# Patient Record
Sex: Female | Born: 1990 | Race: Black or African American | Hispanic: No | Marital: Single | Smoking: Current every day smoker
Health system: Southern US, Community
[De-identification: ages and names within clinical notes are randomized; demographics above are authoritative.]

## PROBLEM LIST (undated history)

## (undated) DIAGNOSIS — F419 Anxiety disorder, unspecified: Secondary | ICD-10-CM

## (undated) DIAGNOSIS — F32A Depression, unspecified: Secondary | ICD-10-CM

## (undated) DIAGNOSIS — F319 Bipolar disorder, unspecified: Secondary | ICD-10-CM

## (undated) HISTORY — DX: Depression, unspecified: F32.A

## (undated) HISTORY — DX: Anxiety disorder, unspecified: F41.9

## (undated) HISTORY — PX: KELOID EXCISION: SHX1856

---
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
## 2008-03-16 ENCOUNTER — Emergency Department: Payer: Self-pay | Admitting: Emergency Medicine

## 2009-02-19 ENCOUNTER — Emergency Department: Payer: Self-pay | Admitting: Emergency Medicine

## 2009-02-21 ENCOUNTER — Emergency Department: Payer: Self-pay | Admitting: Emergency Medicine

## 2009-06-24 ENCOUNTER — Emergency Department: Payer: Self-pay | Admitting: Emergency Medicine

## 2010-01-02 ENCOUNTER — Emergency Department: Payer: Self-pay | Admitting: Unknown Physician Specialty

## 2011-01-21 ENCOUNTER — Emergency Department: Payer: Self-pay | Admitting: Internal Medicine

## 2013-05-10 HISTORY — PX: INCISION AND DRAINAGE PERIRECTAL ABSCESS: SHX1804

## 2013-05-13 LAB — CBC
HGB: 14.5 g/dL (ref 12.0–16.0)
MCH: 30.6 pg (ref 26.0–34.0)
MCHC: 35 g/dL (ref 32.0–36.0)
MCV: 87 fL (ref 80–100)
Platelet: 177 10*3/uL (ref 150–440)
RBC: 4.75 10*6/uL (ref 3.80–5.20)
RDW: 12.9 % (ref 11.5–14.5)
WBC: 10.7 10*3/uL (ref 3.6–11.0)

## 2013-05-13 LAB — URINALYSIS, COMPLETE
Bilirubin,UR: NEGATIVE
Blood: NEGATIVE
Glucose,UR: NEGATIVE mg/dL (ref 0–75)
Ph: 7 (ref 4.5–8.0)
RBC,UR: <1 /HPF (ref 0–5)
Specific Gravity: 1.009 (ref 1.003–1.030)
Squamous Epithelial: 2
WBC UR: <1 /HPF (ref 0–5)

## 2013-05-13 LAB — SALICYLATE LEVEL: Salicylates, Serum: 4.1 mg/dL — ABNORMAL HIGH

## 2013-05-13 LAB — COMPREHENSIVE METABOLIC PANEL
Albumin: 4.1 g/dL (ref 3.4–5.0)
Calcium, Total: 9.2 mg/dL (ref 8.5–10.1)
Chloride: 103 mmol/L (ref 98–107)
Co2: 25 mmol/L (ref 21–32)
Osmolality: 272 (ref 275–301)
Potassium: 3.3 mmol/L — ABNORMAL LOW (ref 3.5–5.1)
SGOT(AST): 22 U/L (ref 15–37)
SGPT (ALT): 18 U/L (ref 12–78)
Total Protein: 9.5 g/dL — ABNORMAL HIGH (ref 6.4–8.2)

## 2013-05-13 LAB — TSH: Thyroid Stimulating Horm: 1.41 u[IU]/mL

## 2013-05-14 ENCOUNTER — Inpatient Hospital Stay: Payer: Self-pay | Admitting: Psychiatry

## 2013-05-15 LAB — DRUG SCREEN, URINE
Amphetamines, Ur Screen: NEGATIVE (ref ?–1000)
Barbiturates, Ur Screen: NEGATIVE (ref ?–200)
Benzodiazepine, Ur Scrn: POSITIVE (ref ?–200)
Cannabinoid 50 Ng, Ur ~~LOC~~: POSITIVE (ref ?–50)
Cocaine Metabolite,Ur ~~LOC~~: NEGATIVE (ref ?–300)
MDMA (Ecstasy)Ur Screen: NEGATIVE (ref ?–500)
Methadone, Ur Screen: NEGATIVE (ref ?–300)
Opiate, Ur Screen: NEGATIVE (ref ?–300)
Phencyclidine (PCP) Ur S: NEGATIVE (ref ?–25)

## 2013-11-29 ENCOUNTER — Inpatient Hospital Stay: Payer: Self-pay | Admitting: Psychiatry

## 2013-11-29 LAB — DRUG SCREEN, URINE
Amphetamines, Ur Screen: NEGATIVE (ref ?–1000)
BARBITURATES, UR SCREEN: NEGATIVE (ref ?–200)
BENZODIAZEPINE, UR SCRN: NEGATIVE (ref ?–200)
Cannabinoid 50 Ng, Ur ~~LOC~~: POSITIVE (ref ?–50)
Cocaine Metabolite,Ur ~~LOC~~: NEGATIVE (ref ?–300)
MDMA (Ecstasy)Ur Screen: NEGATIVE (ref ?–500)
Methadone, Ur Screen: NEGATIVE (ref ?–300)
Opiate, Ur Screen: NEGATIVE (ref ?–300)
PHENCYCLIDINE (PCP) UR S: NEGATIVE (ref ?–25)
Tricyclic, Ur Screen: NEGATIVE (ref ?–1000)

## 2013-11-29 LAB — COMPREHENSIVE METABOLIC PANEL
ANION GAP: 5 — AB (ref 7–16)
Albumin: 4.6 g/dL (ref 3.4–5.0)
Alkaline Phosphatase: 64 U/L
BILIRUBIN TOTAL: 0.6 mg/dL (ref 0.2–1.0)
BUN: 6 mg/dL — ABNORMAL LOW (ref 7–18)
CALCIUM: 9.3 mg/dL (ref 8.5–10.1)
Chloride: 105 mmol/L (ref 98–107)
Co2: 26 mmol/L (ref 21–32)
Creatinine: 0.82 mg/dL (ref 0.60–1.30)
EGFR (African American): >60
EGFR (Non-African Amer.): >60
GLUCOSE: 111 mg/dL — AB (ref 65–99)
Osmolality: 270 (ref 275–301)
Potassium: 3.3 mmol/L — ABNORMAL LOW (ref 3.5–5.1)
SGOT(AST): 28 U/L (ref 15–37)
SGPT (ALT): 19 U/L (ref 12–78)
SODIUM: 136 mmol/L (ref 136–145)
Total Protein: 10.5 g/dL — ABNORMAL HIGH (ref 6.4–8.2)

## 2013-11-29 LAB — URINALYSIS, COMPLETE
BILIRUBIN, UR: NEGATIVE
Bacteria: NONE SEEN
Blood: NEGATIVE
Glucose,UR: NEGATIVE mg/dL (ref 0–75)
Leukocyte Esterase: NEGATIVE
Nitrite: NEGATIVE
Ph: 6 (ref 4.5–8.0)
Protein: 100
Specific Gravity: 1.026 (ref 1.003–1.030)
Squamous Epithelial: 4
WBC UR: 1 /HPF (ref 0–5)

## 2013-11-29 LAB — CBC
HCT: 41.1 % (ref 35.0–47.0)
HGB: 14.1 g/dL (ref 12.0–16.0)
MCH: 30.5 pg (ref 26.0–34.0)
MCHC: 34.3 g/dL (ref 32.0–36.0)
MCV: 89 fL (ref 80–100)
PLATELETS: 167 10*3/uL (ref 150–440)
RBC: 4.62 10*6/uL (ref 3.80–5.20)
RDW: 13.1 % (ref 11.5–14.5)
WBC: 11.5 10*3/uL — ABNORMAL HIGH (ref 3.6–11.0)

## 2013-11-29 LAB — ETHANOL: Ethanol: <3 mg/dL

## 2013-11-29 LAB — ACETAMINOPHEN LEVEL: Acetaminophen: <2 ug/mL

## 2013-11-29 LAB — SALICYLATE LEVEL: SALICYLATES, SERUM: 4.3 mg/dL — AB

## 2014-07-24 IMAGING — CT CT HEAD WITHOUT CONTRAST
1 series · 16 of 30 positions shown, 20 images · non-contrast
Comparison: none

REASON FOR EXAM: psychosis
COMMENTS:

PROCEDURE:     CT  - CT HEAD WITHOUT CONTRAST  - May 14, 2013 [DATE]
RESULT:     History: Psychosis.
Comparison Study: No prior.

[Series 4: soft (id) · axial · 0.42mm/px · z∈[-144,-10]mm · 16 of 31 slices shown, 20 images]
[im 2/31  brain]
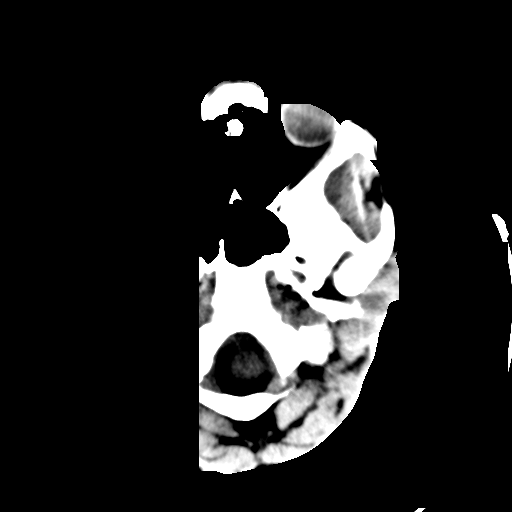
[im 2/31  bone]
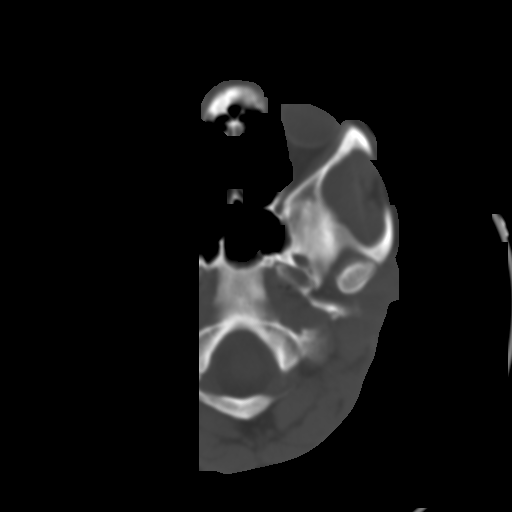
[im 4/31  brain]
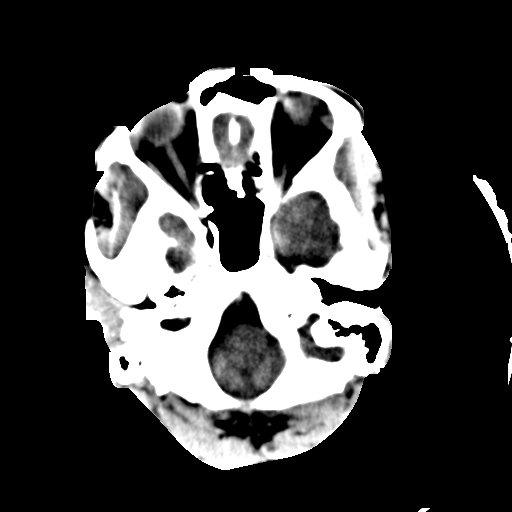
[im 6/31  brain]
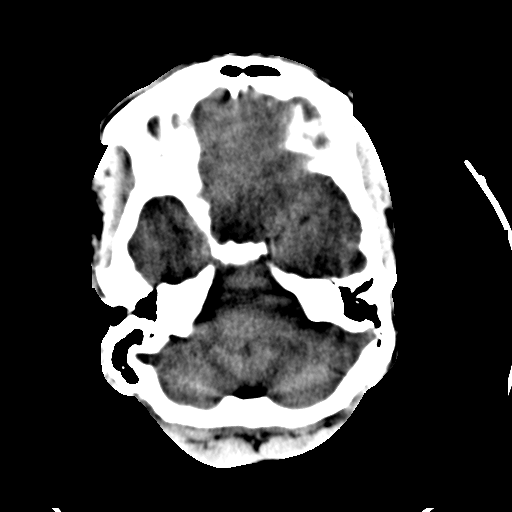
[im 8/31  brain]
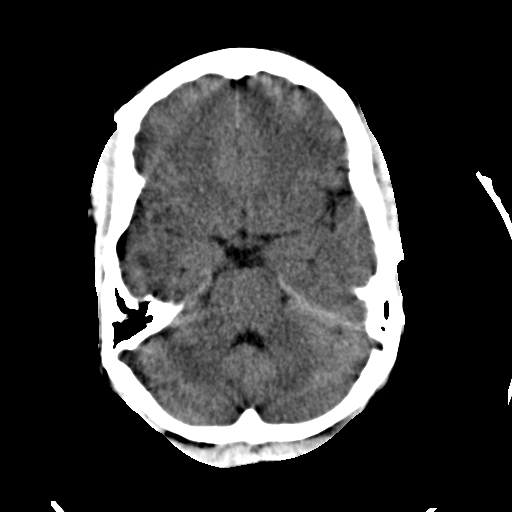
[im 9/31  brain]
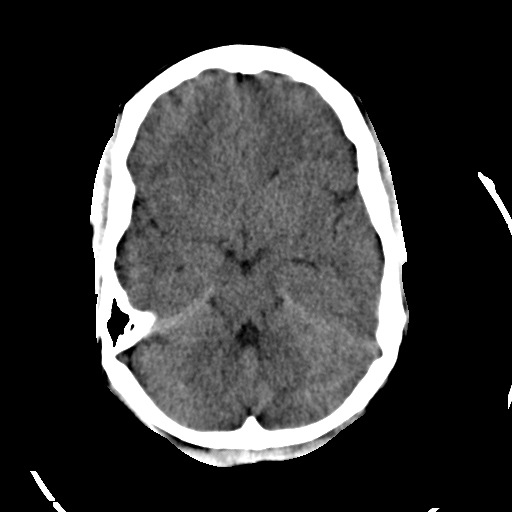
[im 9/31  bone]
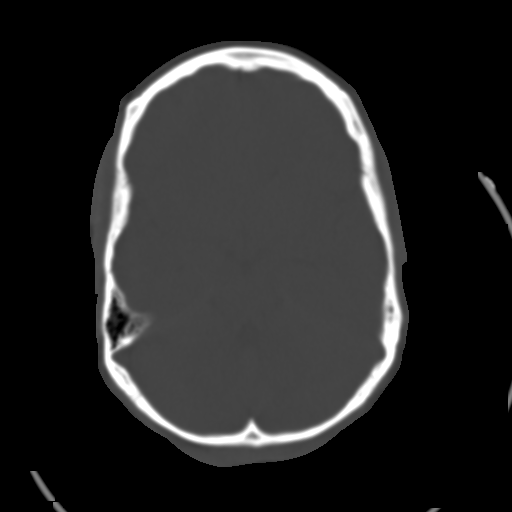
[im 11/31  brain]
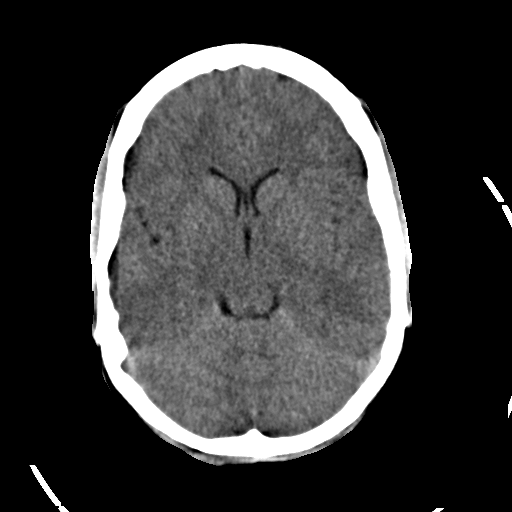
[im 13/31  brain]
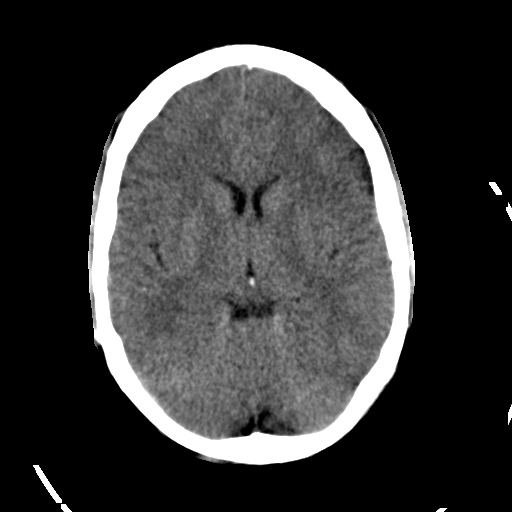
[im 15/31  brain]
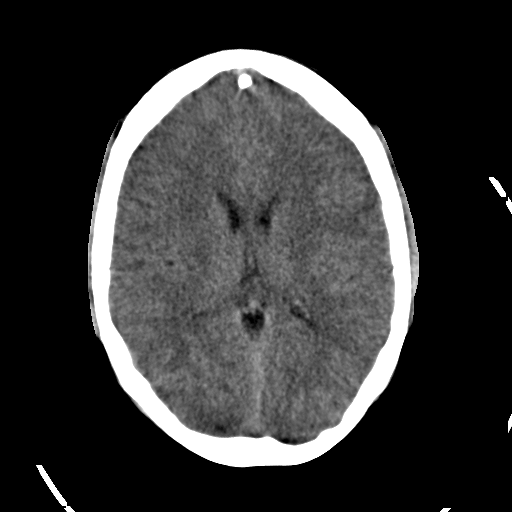
[im 16/31  brain]
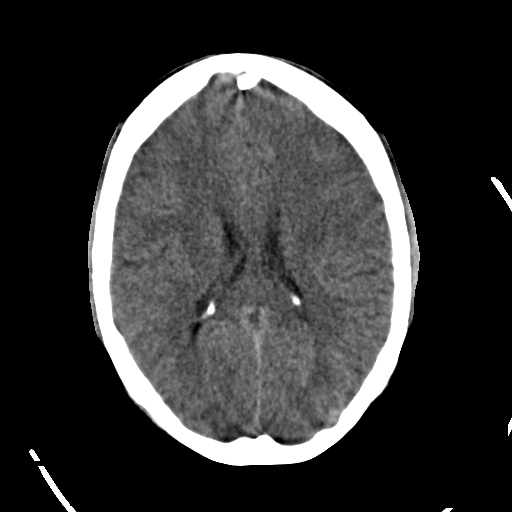
[im 16/31  bone]
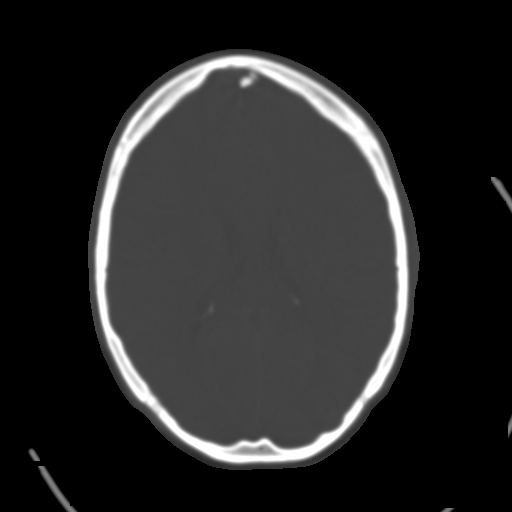
[im 18/31  brain]
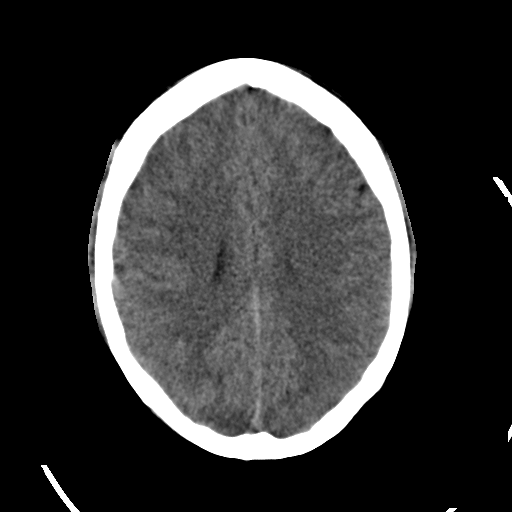
[im 20/31  brain]
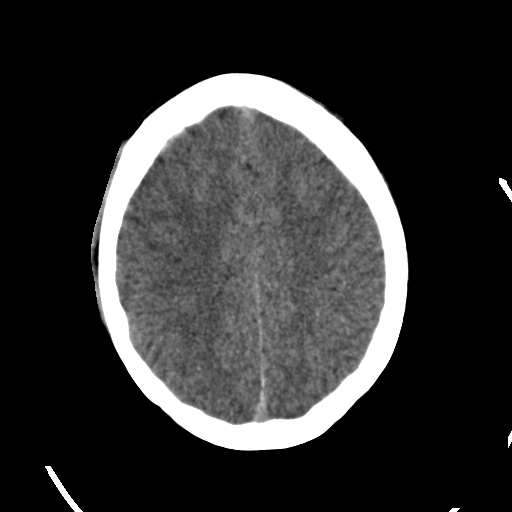
[im 22/31  brain]
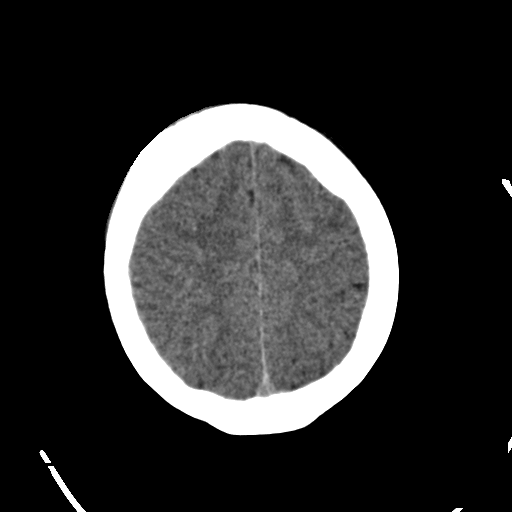
[im 23/31  brain]
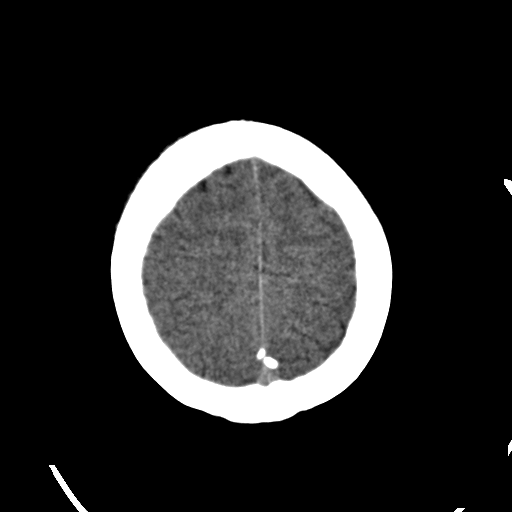
[im 23/31  bone]
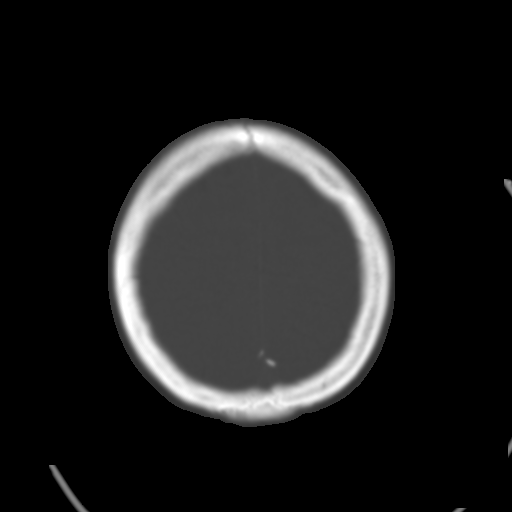
[im 25/31  brain]
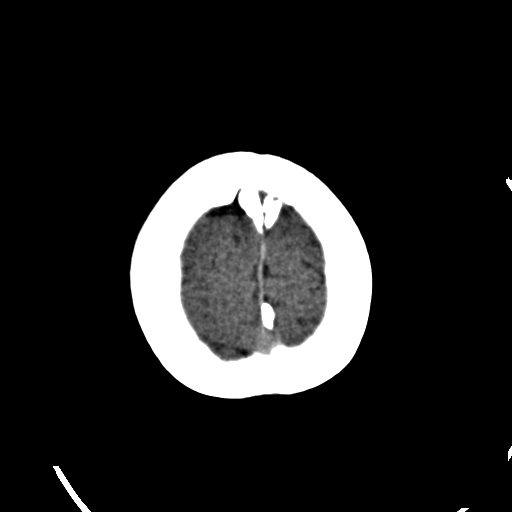
[im 27/31  brain]
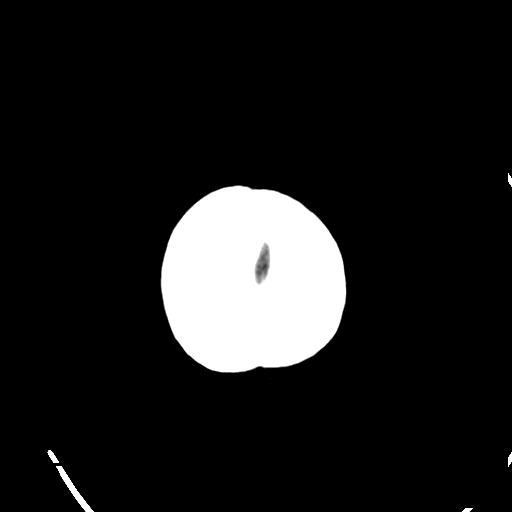
[im 29/31  brain]
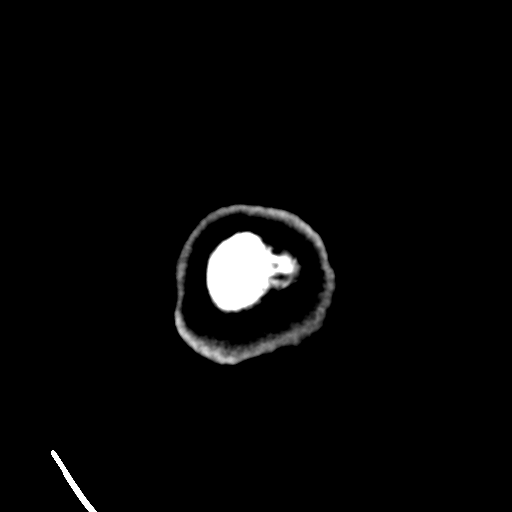

[16 of 30 positions shown; findings below may reference images not displayed]

FINDINGS: Standard nonenhanced CT obtained. No mass or hydrocephalus. No
hemorrhage. No acute bony abnormality identified.
IMPRESSION: No acute abnormality.

## 2014-10-15 ENCOUNTER — Emergency Department: Payer: Self-pay | Admitting: Emergency Medicine

## 2014-10-15 LAB — COMPREHENSIVE METABOLIC PANEL
ALBUMIN: 3.8 g/dL (ref 3.4–5.0)
ALK PHOS: 69 U/L
ALT: 26 U/L
AST: 34 U/L (ref 15–37)
Anion Gap: 4 — ABNORMAL LOW (ref 7–16)
BUN: 7 mg/dL (ref 7–18)
Bilirubin,Total: 0.4 mg/dL (ref 0.2–1.0)
CHLORIDE: 106 mmol/L (ref 98–107)
Calcium, Total: 8.9 mg/dL (ref 8.5–10.1)
Co2: 27 mmol/L (ref 21–32)
Creatinine: 0.89 mg/dL (ref 0.60–1.30)
EGFR (Non-African Amer.): >60
Glucose: 97 mg/dL (ref 65–99)
Osmolality: 272 (ref 275–301)
Potassium: 3.8 mmol/L (ref 3.5–5.1)
Sodium: 137 mmol/L (ref 136–145)
TOTAL PROTEIN: 8.9 g/dL — AB (ref 6.4–8.2)

## 2014-10-15 LAB — URINALYSIS, COMPLETE
BILIRUBIN, UR: NEGATIVE
BLOOD: NEGATIVE
Glucose,UR: NEGATIVE mg/dL (ref 0–75)
Ketone: NEGATIVE
LEUKOCYTE ESTERASE: NEGATIVE
NITRITE: NEGATIVE
PROTEIN: NEGATIVE
Ph: 6 (ref 4.5–8.0)
RBC, UR: NONE SEEN /HPF (ref 0–5)
Specific Gravity: 1.021 (ref 1.003–1.030)
Squamous Epithelial: 3
WBC UR: NONE SEEN /HPF (ref 0–5)

## 2014-10-15 LAB — CBC WITH DIFFERENTIAL/PLATELET
Basophil #: 0.1 10*3/uL (ref 0.0–0.1)
Basophil %: 0.6 %
EOS PCT: 4.2 %
Eosinophil #: 0.3 10*3/uL (ref 0.0–0.7)
HCT: 43 % (ref 35.0–47.0)
HGB: 14.5 g/dL (ref 12.0–16.0)
LYMPHS ABS: 1.5 10*3/uL (ref 1.0–3.6)
LYMPHS PCT: 18.7 %
MCH: 30.2 pg (ref 26.0–34.0)
MCHC: 33.6 g/dL (ref 32.0–36.0)
MCV: 90 fL (ref 80–100)
Monocyte #: 0.6 x10 3/mm (ref 0.2–0.9)
Monocyte %: 7 %
Neutrophil #: 5.7 10*3/uL (ref 1.4–6.5)
Neutrophil %: 69.5 %
Platelet: 203 10*3/uL (ref 150–440)
RBC: 4.79 10*6/uL (ref 3.80–5.20)
RDW: 13.6 % (ref 11.5–14.5)
WBC: 8.2 10*3/uL (ref 3.6–11.0)

## 2014-10-15 LAB — TSH: Thyroid Stimulating Horm: 1.75 u[IU]/mL

## 2015-01-17 NOTE — H&P (Signed)
PATIENT NAME:  Desiree Young, Desiree Young MR#:  [id number] DATE OF BIRTH:  [birth year]  DATE OF ADMISSION:  05/14/2013  REFERRING PHYSICIAN: Emergency Room M.D.   ATTENDING PHYSICIAN: Kristine LineaJolanta Pucilowska, M.D.   IDENTIFYING DATA: Desiree Young is a 24 year old female with no past psychiatric history.   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 She fears that there will be consequences. Yesterday with her boyfriend, she went to [employer], and she had the feeling that people were following her, people from work. She felt the same way in the parking lot. She became rather upset and fearful. Her family brought her to the Emergency Room. The patient denies any symptoms of depression, anxiety. She denies alcohol or illicit substance use except that she smokes marijuana regularly, 3 times a day for years. Her father recalls that 6 months or so ago, the patient has a similar brief episode of paranoia that resolved completely without treatment. She denies auditory hallucinations.   PAST PSYCHIATRIC HISTORY: None. No hospitalizations, treatment or suicide attempts.   FAMILY PSYCHIATRIC HISTORY: There was a great uncle or grandfather, she is not certain, with some kind of mental illness.   PAST MEDICAL HISTORY: None.   ALLERGIES: No known drug allergies.   MEDICATIONS ON ADMISSION: None.   SOCIAL HISTORY: She lives with her mom, dad and grandmother. She works at [employer]. She has a steady boyfriend. There are no legal issues. She uses marijuana.    REVIEW OF SYSTEMS:   CONSTITUTIONAL: No fevers or chills. No weight changes.  EYES: No double or blurred vision.  ENT: No hearing loss.  RESPIRATORY: No shortness of breath or cough.  CARDIOVASCULAR: No chest pain or orthopnea.  GASTROINTESTINAL: No abdominal pain, nausea, vomiting or diarrhea.  GENITOURINARY: No incontinence or frequency.  ENDOCRINE: No heat or cold intolerance.  LYMPHATIC: No anemia or easy bruising.  INTEGUMENTARY: No acne or rash.  MUSCULOSKELETAL: No muscle or joint pain.  NEUROLOGIC: No tingling or weakness.  PSYCHIATRIC: See history of present illness for details.   PHYSICAL EXAMINATION:  VITAL SIGNS: Blood pressure 142/98, pulse 74, respirations 18, temperature 98.2.  GENERAL: This is a slender young female in no acute distress.  HEENT: The pupils are equal, round and reactive to light. Sclerae anicteric.  NECK: Supple. No thyromegaly.  LUNGS: Clear to auscultation. No dullness to percussion.  HEART: Regular rhythm and rate. No murmurs, rubs or gallops.  ABDOMEN: Soft, nontender, nondistended. Positive bowel sounds.  MUSCULOSKELETAL: Normal muscle strength in all extremities.  SKIN: No rashes or bruises.  LYMPHATIC: No cervical adenopathy.  NEUROLOGIC: Cranial nerves II through XII are intact.   LABORATORY DATA: Chemistries are within normal limits except for blood glucose of 154, sodium 135 and potassium 3.3. Blood alcohol level is zero. LFTs within normal limits except for total protein of 9.5. TSH 1.41. Urine tox screen positive for benzodiazepines and cannabinoids. CBC within normal limits. Urinalysis is not suggestive of urinary tract infection. Serum acetaminophen and salicylates are  low. Urine pregnancy test is negative.   MENTAL STATUS EXAMINATION: The patient is alert and oriented to person, place, time and somewhat to situation. She is pleasant, polite and cooperative. She is in bed, wearing hospital scrubs. She maintains good eye contact. Her  speech is soft. Mood is depressed with anxious affect. Thought processing is logical and goal oriented. Thought content: She denies suicidal or homicidal ideation. She is delusional and paranoid. She denies auditory hallucinations. Her cognition is grossly intact. Her insight and judgment are fair.   SUICIDE RISK ASSESSMENT ON ADMISSION: This is a patient with no past psychiatric history, with first psychotic break, most likely related to substance use.   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  AXIS II: Deferred.  AXIS III: Deferred.  AXIS IV: New onset mental illness.  AXIS V: Global assessment of functioning on admission 25.   PLAN: The patient was admitted to [facility] Medicine unit for safety, stabilization and medication management. She was initially placed on suicide precautions and was closely monitored for any unsafe behaviors. She underwent full psychiatric and risk assessment. She received pharmacotherapy, individual and group psychotherapy, substance abuse counseling and support from therapeutic milieu.  1. Psychosis: We will start Risperdal and a sleeping pill.  2. Collateral data.  3. Disposition: She will be discharged to home.   ____________________________ Ellin Goodie. Jennet Maduro, MD jbp:gb D: 05/14/2013 21:03:14 ET T: 05/14/2013 21:49:51 ET JOB#: [id number]  cc: Jolanta B. Jennet Maduro, MD, <Dictator> Shari Prows MD ELECTRONICALLY SIGNED 05/15/2013 23:30

## 2015-01-18 NOTE — H&P (Signed)
PATIENT NAME:  Desiree Young, Desiree Young MR#:  [id number] DATE OF BIRTH:  September 23, 1991  DATE OF ADMISSION:  11/29/2013  DATE OF ASSESSMENT: 11/30/2013   REFERRING PHYSICIAN: Emergency Room MD   ATTENDING PHYSICIAN: Shakiyah Cirilo B. Jennet Maduro, MD   IDENTIFYING DATA: [name] is a 24 year old female with history of psychosis.   CHIEF COMPLAINT: "I need my medications."   HISTORY OF PRESENT ILLNESS: [name] was hospitalized at [facility] in August 2014 for an episode of paranoia. At that time, we thought that it was precipitated by cannabis abuse. The patient was discharged after a very brief hospitalization and prescribed Risperdal 2 mg in the evening. She reports that she did take it for 2 months and then she stopped. She was okay all along up until 3 days ago when she again became paranoid. She was very suspicious that people at work are watching her and may be trying to hurt her. As a result, she lost her job. She was suspicious that her boyfriend has a relationship with her best friend and confronted her best friend about it. She thinks now that it was most likely not real. She also started behaving strangely at home. She lives with her parents, her sister, and her niece. She had an argument with her family and in [place], she stated that if they want her out of the house, then she may as well kill herself. She says that she never meant to hurt herself. She is a good Christian, knows that she will not go to heaven should she hurt herself. She denies any symptoms of depression. Even though she tells me that she is depressed, there is no crying, no feeling of guilt, hopelessness, worthlessness, no changes in level of energy. There is good concentration and memory. She does not complain of social isolation, anhedonia. She sleeps well. She enjoys life, except for the past 3 days. Dr. Garnetta Buddy, admitting psychiatrist, put her on Zoloft, but she was sweaty all over last night, had to change her  bedding and clothes, and would prefer not to take the Zoloft. She is open to taking Risperdal. She is a marijuana smoker and has been smoking daily for extended period of time. She does not believe that marijuana has anything to do with her symptoms, as this is just the second brief episode of mental illness, while smoking has been going on all along. She does agree that there are different batches of marijuana and that it is not impossible that it is relating to her substance use. At the moment, she adamantly denies thoughts of hurting herself or others. There are no symptoms suggestive of bipolar mania. She does not appear anxious.   PAST PSYCHIATRIC HISTORY: One hospitalization in August 2014 for substance-induced psychosis. This resolved with Risperdal treatment. She does not have a psychiatrist at the moment. She denies ever attempting a suicide or attempting to hurt herself.   FAMILY PSYCHIATRIC HISTORY: Unspecified mental illness in the family.   PAST MEDICAL HISTORY: None.   ALLERGIES: No known drug allergies.   MEDICATIONS ON ADMISSION: None.   SOCIAL HISTORY: The patient had a part-time job through a [employer], but lost it the same way she lost her other job in August. 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 Per Dr. Micki Riley note, the patient lives by herself. She has a steady boyfriend. In spite of accusations, she thinks that  the relationship will survive. She denies legal charges. She denies, other than marijuana, substance use. She does not drink alcohol.   REVIEW OF SYSTEMS: CONSTITUTIONAL: No fevers or chills. Positive for gradual weight gain that is welcomed.  EYES: No double or blurred vision.  ENT: No hearing loss.  RESPIRATORY: No shortness of breath or cough.  CARDIOVASCULAR: No chest pain or orthopnea.  GASTROINTESTINAL: No abdominal pain, nausea, vomiting, or diarrhea.   GENITOURINARY: No incontinence or frequency.  ENDOCRINE: No heat or cold intolerance.  LYMPHATIC: No anemia or easy bruising.  INTEGUMENTARY: No acne or rash.  MUSCULOSKELETAL: No muscle or joint pain.  NEUROLOGIC: No tingling or weakness.  PSYCHIATRIC: See history of present illness for details.   PHYSICAL EXAMINATION: VITAL SIGNS: Blood pressure 130/89, pulse 79, respirations 18, temperature 98.5.  GENERAL: This is a slender young female in no acute distress.  HEENT: The pupils are equal, round, and reactive to light. Sclerae are anicteric.  NECK: Supple. No thyromegaly.  LUNGS: Clear to auscultation. No dullness to percussion.  HEART: Regular rhythm and rate. No murmurs, rubs, or gallops.  ABDOMEN: Soft, nontender, nondistended. Positive bowel sounds.  MUSCULOSKELETAL: Normal muscle strength in all extremities.  SKIN: No rashes or bruises.  LYMPHATIC: No cervical adenopathy.  NEUROLOGIC: Cranial nerves II through XII are intact.   LABORATORY DATA: Chemistries are within normal limits, except for potassium 3.3. Blood alcohol level zero. LFTs within normal limits. Urine tox screen positive for cannabis. CBC within normal limits, except for white blood count of 11.5. Urinalysis is not suggestive of urinary tract infection. Serum acetaminophen less than 2. Serum salicylates 4.3. Urine pregnancy test is negative.   MENTAL STATUS EXAMINATION ON ADMISSION: The patient is alert and oriented to person, place, time, and situation. She is pleasant, polite, and cooperative. She is well groomed and casually dressed. She maintains good eye contact. Her speech is soft. Mood is fine with full affect. Thought process is logical and goal oriented. Thought content: She denies thoughts of hurting herself or others, but did voice suicidal thoughts on admission. She is paranoid and delusional. She denies experiencing auditory or visual hallucinations. Her cognition is grossly intact. She registers 3 out of 3  and recalls 3 out of 3 objects after 5 minutes. She can spell "world" forward and backward. She knows the current president. Her intelligence is average with average fund of knowledge. Her insight and judgment seem poor.  INITIAL DIAGNOSES:  AXIS I: :Substance-induced psychosis, marijuana dependence.  AXIS II: Deferred.  AXIS III: Deferred.  AXIS IV: Substance abuse, mental illness, relationship, family conflict, occupational, access to care.  AXIS V: Global Assessment of Functioning 25.   PLAN: The patient was admitted to [facility] Medicine Unit for safety, stabilization, and medication management. She was initially placed on suicide precautions and was closely monitored for any unsafe behaviors. She underwent full psychiatric and risk assessment. She received pharmacotherapy, individual and group psychotherapy, substance abuse counseling, and support from therapeutic milieu.  1.  Suicidal ideation: The patient adamantly denies.  2.  Mood and psychosis: Dr. Garnetta Buddy placed her on Zoloft and low-dose Risperdal. We will discontinue Zoloft due to heavy sweating at night and increase Risperdal to 2, 3 mg at night. The patient has no health insurance. She could benefit from injectable antipsychotic that may be easier obtainable in the community.  3.  Substance abuse: She has little insight into this problem and is unwilling to stop. She declines substance abuse treatment participation.  4.  Disposition: Social worker will contact the family to see if we can obtain collateral data. She may not need a long hospitalization if compliant with medicines.   ____________________________ Ellin GoodieJolanta B. Jennet MaduroPucilowska, MD jbp:jcm D: 11/30/2013 14:25:14 ET T: 11/30/2013 15:06:25 ET JOB#: [id number]  cc: Jermaine Tholl B. Jennet MaduroPucilowska, MD, <Dictator> Shari ProwsJOLANTA B Artis Beggs MD ELECTRONICALLY SIGNED 12/08/2013 15:10

## 2015-01-18 NOTE — Consult Note (Signed)
PATIENT NAME:  Desiree Young, Desiree Young MR#:  [id number] DATE OF BIRTH:  Feb 06, 1991  DATE OF ADMISSION: 11/29/2013   DATE OF CONSULTATION:  11/29/2013  REFERRING PHYSICIAN:  Aneta Mins A. Scotty Court, MD CONSULTING PHYSICIAN:  Ardeen Fillers. Garnetta Buddy, MD  REASON FOR CONSULTATION: Becoming agitated and paranoid.   HISTORY OF PRESENT ILLNESS: The patient is a 24 year old African-American female with a history of paranoia and noncompliance on the medication, who presented to the ED as she stated she was becoming more depressed and needed help. History was obtained from the patient. She reported that she thinks too much and talks too much. She reported that she was brought to the hospital by her boyfriend. She reported that initially she was not having any problems with her boyfriend, and she only told him that I love you. She also mentioned that she was having depression. She stated that she was last discharged from the hospital approximately 6 months ago, and at that time, she stopped going to the doctor and stopped taking her medications. She reported that she came to the hospital at that time as she felt that people were trying to set her up. She continues to have the same feelings, and she thinks that people are trying to set her up and her mind is racing all the time. She has feelings which build up inside her, and then she loses control. She becomes agitated, yells and screams most of the time. She stated that she loses control and is fearful at the time. The patient has feelings of depression and anxiety. She stated that she also uses marijuana at least 3 times per week. She also admits to having visual hallucinations of shadows. However, she became irate during the interview and stated that I should tell her what I wanted her to say. She was also focused on the medications and reported that she does not want to stay in the hospital for more than 2 days as she has her birthday on [birth year]. The patient was noted to be smiling  inappropriately during the interview. She reported that she does not want to stay for longer in the hospital at this time. She did not admit to having auditory hallucinations, but appears to be responding to internal stimuli.   PAST PSYCHIATRIC HISTORY: The patient was admitted to the hospital in August due to having paranoia. The people from the job were after her and were giving her bad instructions, and she was making irregular socks. She was given Risperdal at that time, and she responded well to the medication. However, she has a history of noncompliance with the medication, and she did not keep her outpatient appointments as well. There is no history of suicide attempts.   FAMILY HISTORY: The patient has a history of some mental illness in her family members.   PAST MEDICAL HISTORY: None.   ALLERGIES: No known drug allergies.   CURRENT MEDICATIONS: None, as she has been noncompliant with her medications.   SOCIAL HISTORY: The patient currently lives by herself. She reported that she has a part-time job, but she did not elaborate on the same. She used to work in a [employer] in the past. She stated that she has a steady boyfriend. Denied having any pending legal charges. She also smokes marijuana on a regular basis.   REVIEW OF SYSTEMS:  CONSTITUTIONAL: Denies any fever or chills. No weight changes.  EYES: No double or blurred vision.  ENT: No hearing loss.  RESPIRATORY: No shortness of breath or  cough.  CARDIOVASCULAR: No chest pain or orthopnea.  GASTROINTESTINAL: No abdominal pain, nausea, vomiting or diarrhea. GENITOURINARY: No incontinence or frequency.  ENDOCRINE: No heat or cold intolerance.  LYMPHATIC: No anemia or easy bruising.  INTEGUMENTARY: No acne or rash.  MUSCULOSKELETAL: Denies muscle or joint pain.   PHYSICAL EXAMINATION:  VITAL SIGNS: Temperature 98, pulse 98, respirations 18, blood pressure 133/79.   LABORATORY DATA: Blood glucose 111, BUN 6, creatinine 0.82,  sodium 136, potassium 3.3, chloride 105, bicarbonate 26, anion gap 5, osmolality 270, calcium 9.3. Blood alcohol level less than 3. Protein 10.5, albumin 4.6, bilirubin 0.6, alkaline phosphatase 54, AST 28, ALT 19. UDS positive for cannabinoids. WBC 11.5, hemoglobin 14.1, hematocrit 41.1, platelet count 167, MCV 89, RDW 13.1.   MENTAL STATUS EXAMINATION: The patient is a thinly built female who appeared her stated age. She was awake, alert and oriented x3. She became irate as well as noted to be screaming and yelling in the Behavioral Health Unit. She was unable to control her behavior and appeared to be agitated and was screaming and yelling. She was also becoming more paranoid and was banging her head against the wall. Her mood was anxious. Her thought process tangential. Thought content was delusional. She was responding to internal stimuli. Her cognition was intact. Her insight and judgment were poor. Language was intact. Fund of knowledge was appropriate.   DIAGNOSTIC IMPRESSION:  AXIS I:  1. Psychotic disorder, not otherwise specified. Rule out schizophrenia.  2. Cannabis dependence.  AXIS II: None.  AXIS III: None noted.   TREATMENT PLAN:  1. The patient will be admitted to [facility] for stabilization and safety.  2. She will be given a STAT dose of olanzapine 5 mg at this time.  3. She will be started back on Risperdal 0.5 mg p.o. b.i.d.  4. She will also be started on Zoloft 50 mg p.o. daily.  5. She will also be given trazodone 50 mg at bedtime.  6. The patient will be managed closely by the treatment team, and once she becomes stable, she will be discharged back home. Will obtain collateral history from family members.   Thank you for allowing me to participate in the care of this patient.   ____________________________ Ardeen FillersUzma S. Garnetta BuddyFaheem, MD usf:lb D: 11/29/2013 10:54:41 ET T: 11/29/2013 11:12:24 ET JOB#: [id number]  cc: Ardeen FillersUzma S. Garnetta BuddyFaheem, MD,  <Dictator> Rhunette CroftUZMA S Melitta Tigue MD ELECTRONICALLY SIGNED 11/29/2013 13:35

## 2016-05-05 ENCOUNTER — Emergency Department
Admission: EM | Admit: 2016-05-05 | Discharge: 2016-05-05 | Disposition: A | Discharge status: Home / Self Care | Payer: Self-pay | Attending: Emergency Medicine | Admitting: Emergency Medicine

## 2016-05-05 DIAGNOSIS — F17200 Nicotine dependence, unspecified, uncomplicated: Secondary | ICD-10-CM | POA: Insufficient documentation

## 2016-05-05 DIAGNOSIS — K611 Rectal abscess: Secondary | ICD-10-CM | POA: Insufficient documentation

## 2016-05-05 HISTORY — DX: Bipolar disorder, unspecified: F31.9

## 2016-05-05 LAB — POCT PREGNANCY, URINE: PREG TEST UR: NEGATIVE

## 2016-05-05 MED ORDER — HYDROCODONE-ACETAMINOPHEN 5-325 MG PO TABS: 1.0000 | ORAL_TABLET | ORAL | 0 refills | Status: DC | PRN

## 2016-05-05 MED ORDER — SULFAMETHOXAZOLE-TRIMETHOPRIM 800-160 MG PO TABS: 1.0000 | ORAL_TABLET | Freq: Two times a day (BID) | ORAL | 0 refills | Status: DC

## 2016-05-05 NOTE — ED Notes (Signed)
Pt to ed with c/o abscess to buttocks x 3 days, hx of same.  Pt reports increased swelling and pain with sitting.

## 2016-05-05 NOTE — ED Triage Notes (Signed)
Pt c/o abscess to right buttock X 3 days, no fever or odor. Prior hx of abcess. Pt alert and oriented X4, active, cooperative, pt in NAD. RR even and unlabored, color WNL.

## 2016-05-05 NOTE — ED Provider Notes (Signed)
[facility] Emergency Department Provider Note  ____________________________________________   First MD Initiated Contact with Patient 05/05/16 1409     (approximate)  I have reviewed the triage vital signs and the nursing notes.   HISTORY  Chief Complaint Abscess   HPI Desiree Young is a 25 y.o. female is here with complaint of abscess the last 3 days. Patient is unaware of any fever or chills. She states she has had a history of abscesses in the past. Patient has not been seen by a surgeon in the past for her complaint. She states that this is the same area that she's had problems with in the past. She states there is been increased swelling and pain with sitting. She has not taken any over-the-counter medication but has been doing a sitz bath once a day and using warm compresses 2-3 times per day. Pain is increased with sitting. Patient states when she is lying on her right side this gives her some relief. She rates her pain as a 10 over 10.     Past Medical History:  Diagnosis Date  . Bipolar 1 disorder (HCC)     There are no active problems to display for this patient.   History reviewed. No pertinent surgical history.  Prior to Admission medications   Medication Sig Start Date End Date Taking? Authorizing Provider  HYDROcodone-acetaminophen (NORCO/VICODIN) 5-325 MG tablet Take 1 tablet by mouth every 4 (four) hours as needed for moderate pain. 05/05/16   Tommi Rumpshonda L Wendelyn Kiesling, PA-C  sulfamethoxazole-trimethoprim (BACTRIM DS,SEPTRA DS) 800-160 MG tablet Take 1 tablet by mouth 2 (two) times daily. 05/05/16   Tommi Rumpshonda L Stephanieann Popescu, PA-C    Allergies Review of patient's allergies indicates no known allergies.  No family history on file.  Social History Social History  Substance Use Topics  . Smoking status: Current Every Day Smoker  . Smokeless tobacco: Never Used  . Alcohol use Yes    Review of Systems Constitutional: No  fever/chills Cardiovascular: Denies chest pain. Respiratory: Denies shortness of breath. Gastrointestinal:   No nausea, no vomiting.   Genitourinary: Negative for dysuria. Musculoskeletal: Negative for back pain. Skin: Negative for rash. Positive for abscess. Neurological: Negative for headaches, focal weakness or numbness.  10-point ROS otherwise negative.  ____________________________________________   PHYSICAL EXAM:  VITAL SIGNS: ED Triage Vitals  Enc Vitals Group     BP 05/05/16 1359 123/82     Pulse Rate 05/05/16 1359 98     Resp 05/05/16 1359 16     Temp 05/05/16 1359 98.8 F (37.1 C)     Temp Source 05/05/16 1359 Oral     SpO2 05/05/16 1359 99 %     Weight 05/05/16 1359 167 lb (75.8 kg)     Height 05/05/16 1359 5\' 5"  (1.651 m)     Head Circumference --      Peak Flow --      Pain Score 05/05/16 1400 10     Pain Loc --      Pain Edu? --      Excl. in GC? --     Constitutional: Alert and oriented. Well appearing and in no acute distress. Eyes: Conjunctivae are normal. PERRL. EOMI. Head: Atraumatic. Nose: No congestion/rhinnorhea. Neck: No stridor.   Cardiovascular: Normal rate, regular rhythm. Grossly normal heart sounds.  Good peripheral circulation. Respiratory: Normal respiratory effort.  No retractions. Lungs CTAB. Gastrointestinal: Soft and nontender. No distention.  Musculoskeletal: Moves upper and lower extremities without any difficulty. Normal  gait was noted. Neurologic:  Normal speech and language. No gross focal neurologic deficits are appreciated. No gait instability. Skin:  Skin is warm, dry and intact. There is an area approximately 1 cm vertically present with drainage that is purulent. There is no extending erythema present. Psychiatric: Mood and affect are normal. Speech and behavior are normal.  ____________________________________________   LABS (all labs ordered are listed, but only abnormal results are displayed)  Labs Reviewed  POC  URINE PREG, ED  POCT PREGNANCY, URINE     PROCEDURES  Procedure(s) performed: None  Procedures  Critical Care performed: No  ____________________________________________   INITIAL IMPRESSION / ASSESSMENT AND PLAN / ED COURSE  Pertinent labs & imaging results that were available during my care of the patient were reviewed by me and considered in my medical decision making (see chart for details).    Clinical Course  Patient was placed on Septra DS twice a day for 10 days after a negative pregnancy test. Norco as needed for pain. She is to increase sitz bath's and warm compresses. She was given the name of surgeon on call today who is Dr. Excell Seltzer. She is encouraged to make an appointment to follow-up for this.   ____________________________________________   FINAL CLINICAL IMPRESSION(S) / ED DIAGNOSES  Final diagnoses:  Perirectal abscess      NEW MEDICATIONS STARTED DURING THIS VISIT:  Discharge Medication List as of 05/05/2016  3:14 PM    START taking these medications   Details  HYDROcodone-acetaminophen (NORCO/VICODIN) 5-325 MG tablet Take 1 tablet by mouth every 4 (four) hours as needed for moderate pain., Starting Wed 05/05/2016, Print    sulfamethoxazole-trimethoprim (BACTRIM DS,SEPTRA DS) 800-160 MG tablet Take 1 tablet by mouth 2 (two) times daily., Starting Wed 05/05/2016, Print         Note:  This document was prepared using Dragon voice recognition software and may include unintentional dictation errors.    Tommi Rumps, PA-C 05/05/16 1522    Emily Filbert, MD 05/05/16 [phone]

## 2016-05-05 NOTE — Discharge Instructions (Signed)
Warm compresses frequently. Begin taking antibiotics as directed for the next 10 days. Norco as needed for pain. Follow-up with Dr. Excell Seltzerooper who is the surgeon on call today. Make an appointment to be seen in the office.

## 2016-05-12 ENCOUNTER — Encounter: Payer: Self-pay | Admitting: Surgery

## 2016-05-12 ENCOUNTER — Ambulatory Visit (INDEPENDENT_AMBULATORY_CARE_PROVIDER_SITE_OTHER): Payer: Self-pay | Admitting: Surgery

## 2016-05-12 DIAGNOSIS — L0501 Pilonidal cyst with abscess: Secondary | ICD-10-CM

## 2016-05-12 MED ORDER — HYDROCODONE-ACETAMINOPHEN 5-325 MG PO TABS: 1.0000 | ORAL_TABLET | ORAL | 0 refills | Status: DC | PRN

## 2016-05-12 MED ORDER — SULFAMETHOXAZOLE-TRIMETHOPRIM 800-160 MG PO TABS: 1.0000 | ORAL_TABLET | Freq: Two times a day (BID) | ORAL | 0 refills | Status: DC

## 2016-05-12 NOTE — Progress Notes (Signed)
Surgical Consultation  05/12/2016  Desiree Young is an 25 y.o. female.   CC: Perirectal abscess  HPI: This patient with a perirectal abscess who was started on Bactrim almost 2 weeks ago in the emergency room. The patient had this happen once before several years ago and required I&D. She states that this is in the same place and in the time frame between episodes the area never completely heals and has a small drainage site periodically.  Past Medical History:  Diagnosis Date  . Bipolar 1 disorder (HCC)     No past surgical history on file.  No family history on file.  Social History:  reports that she has been smoking.  She has never used smokeless tobacco. She reports that she drinks alcohol. Her drug history is not on file.  Allergies: No Known Allergies  Medications reviewed.   Review of Systems:   Review of Systems  Constitutional: Positive for fever. Negative for chills.  HENT: Negative.   Eyes: Negative.   Respiratory: Negative.   Cardiovascular: Negative.   Gastrointestinal: Negative.   Genitourinary: Negative.   Musculoskeletal: Negative.   Skin: Negative.   Neurological: Negative.   Endo/Heme/Allergies: Negative.   Psychiatric/Behavioral: Negative.      Physical Exam:  LMP 04/14/2016   Physical Exam  Constitutional: She is oriented to person, place, and time and well-developed, well-nourished, and in no distress. No distress.  Appears uncomfortable preferring to stand  HENT:  Head: Normocephalic and atraumatic.  Eyes: Pupils are equal, round, and reactive to light. Right eye exhibits no discharge. Left eye exhibits no discharge. No scleral icterus.  Neck: Normal range of motion. Neck supple.  Cardiovascular: Normal rate, regular rhythm and normal heart sounds.   Pulmonary/Chest: Effort normal and breath sounds normal. No respiratory distress. She has no wheezes. She has no rales.  Abdominal: Soft. She exhibits no distension. There is no  tenderness. There is no rebound and no guarding.  Musculoskeletal: Normal range of motion. She exhibits no edema or tenderness.  Lymphadenopathy:    She has no cervical adenopathy.  Neurological: She is alert and oriented to person, place, and time.  Skin: Skin is warm and dry. She is not diaphoretic. There is erythema.  Drainage on the midline from the pilonidal area with 2 left lateral fluctuant areas that are necessitating with some erythema.  Psychiatric: Mood and affect normal.      No results found for this or any previous visit (from the past 48 hour(s)). No results found.  Assessment/Plan:  Pilonidal abscess. This is the area pilonidal cyst. It is not perirectal. She has a draining sinus tract on the midline but she also has 2 areas that have necessitated laterally on the left. They require incision and drainage. This clearly represents a pilonidal abscess as opposed to a perirectal abscess Informed consent was obtained concerning the risk of bleeding infection recurrence and the fact that this would only treat the abscess was discussed with her antibiotics will be refilled  After obtaining informed consent the area was prepped with Betadine local anesthetic was infiltrated into the skin and subcutaneous tissues tissues around the 2 fluctuant areas and the previously draining area on the midline.  Once local anesthetic was set the 2 lateral wounds were incised and a large amount of foul-smelling purulence exuded. There was no need to drain the midline as these likely communicate. Dural dressings were placed I will see her back tomorrow morning for reevaluation  Lattie Hawichard E Oriana Horiuchi, MD, FACS

## 2016-05-12 NOTE — Patient Instructions (Signed)
Pilonidal Cyst A pilonidal cyst is a fluid-filled sac. It forms beneath the skin near your tailbone, at the top of the crease of your buttocks. A pilonidal cyst that is not large or infected may not cause symptoms or problems. If the cyst becomes irritated or infected, it may fill with pus. This causes pain and swelling (pilonidal abscess). An infected cyst may need to be treated with medicine, drained, or removed. CAUSES The cause of a pilonidal cyst is not known. One cause may be a hair that grows into your skin (ingrown hair). RISK FACTORS Pilonidal cysts are more common in boys and men. Risk factors include:  Having lots of hair near the crease of the buttocks.  Being overweight.  Having a pilonidal dimple.  Wearing tight clothing.  Not bathing or showering frequently.  Sitting for long periods of time. SIGNS AND SYMPTOMS Signs and symptoms of a pilonidal cyst may include:  Redness.  Pain and tenderness.  Warmth.  Swelling.  Pus.  Fever. DIAGNOSIS Your health care provider may diagnose a pilonidal cyst based on your symptoms and a physical exam. The health care provider may do a blood test to check for infection. If your cyst is draining pus, your health care provider may take a sample of the drainage to be tested at a laboratory. TREATMENT Surgery is the usual treatment for an infected pilonidal cyst. You may also have to take medicines before surgery. The type of surgery you have depends on the size and severity of the infected cyst. The different kinds of surgery include:  Incision and drainage. This is a procedure to open and drain the cyst.  Marsupialization. In this procedure, a large cyst or abscess may be opened and kept open by stitching the edges of the skin to the cyst walls.  Cyst removal. This procedure involves opening the skin and removing all or part of the cyst. HOME CARE INSTRUCTIONS  Follow all of your surgeon's instructions carefully if you had  surgery.  Take medicines only as directed by your health care provider.  If you were prescribed an antibiotic medicine, finish it all even if you start to feel better.  Keep the area around your pilonidal cyst clean and dry.  Clean the area as directed by your health care provider. Pat the area dry with a clean towel. Do not rub it as this may cause bleeding.  Remove hair from the area around the cyst as directed by your health care provider.  Do not wear tight clothing or sit in one place for long periods of time.  There are many different ways to close and cover an incision, including stitches, skin glue, and adhesive strips. Follow your health care provider's instructions on:  Incision care.  Bandage (dressing) changes and removal.  Incision closure removal. SEEK MEDICAL CARE IF:   You have drainage, redness, swelling, or pain at the site of the cyst.  You have a fever.   This information is not intended to replace advice given to you by your health care provider. Make sure you discuss any questions you have with your health care provider.   Document Released: 09/10/2000 Document Revised: 10/04/2014 Document Reviewed: 01/31/2014 Elsevier Interactive Patient Education 2016 [employer]   For your constipation, start taking Miralax 17 G a day and Colace (stool softeners) once a day.

## 2016-05-12 NOTE — Addendum Note (Signed)
Addended by: Adela PortsBONICHE, Braelee Herrle on: 05/12/2016 02:31 PM   Modules accepted: Orders

## 2016-05-13 ENCOUNTER — Ambulatory Visit: Payer: Self-pay | Admitting: Surgery

## 2016-05-13 ENCOUNTER — Telehealth: Payer: Self-pay

## 2016-05-13 NOTE — Telephone Encounter (Signed)
Called patient because we saw that she had rescheduled her appointment. Patient answered my call and I asked how she was feeling. She stated that she was feeling much better. I asked her is she was still having any pain and/or fever, she stated that she did not. I also asked if she had any more drainage and she stated that last night she did but that this morning after she changed her dressing, she noticed cleared drainage. I then asked patient why she had to reschedule her appointment. She stated that she did not drive and had nobody to bring her in today. However, tomorrow she had someone to drive her to her appointment.

## 2016-05-14 ENCOUNTER — Ambulatory Visit (INDEPENDENT_AMBULATORY_CARE_PROVIDER_SITE_OTHER): Payer: Self-pay | Admitting: Surgery

## 2016-05-14 ENCOUNTER — Encounter: Payer: Self-pay | Admitting: Surgery

## 2016-05-14 VITALS — BP 127/84 | HR 101 | Temp 97.9°F | Ht 65.0 in | Wt 157.2 lb

## 2016-05-14 DIAGNOSIS — L0501 Pilonidal cyst with abscess: Secondary | ICD-10-CM

## 2016-05-14 NOTE — Progress Notes (Signed)
Outpatient postop visit  05/14/2016  Anise SalvoJanay Q Puller is an 25 y.o. female.    Procedure: I&D of complex pilonidal cyst  CC: No pain  HPI: Patient feels much better no fevers no pain and minimal clear drainage only. Reviewing this the patient has had this happened multiple times before.  Medications reviewed.    Physical Exam:  BP 127/84 (BP Location: Left Arm, Patient Position: Sitting, Cuff Size: Normal)   Pulse (!) 101   Temp 97.9 F (36.6 C) (Oral)   Ht 5\' 5"  (1.651 m)   Wt 157 lb 3.2 oz (71.3 kg)   LMP 05/14/2016   BMI 26.16 kg/m     PE: Less than erythema nontender no drainage no expressible purulence from either the lateral or the midline wounds    Assessment/Plan:  Complex pilonidal cyst with abscess which is improving. Patient is currently on antibiotics. I will see her back in 2 weeks and discuss definitive care at that time.  Lattie Hawichard E Ndea Kilroy, MD, FACS

## 2016-05-14 NOTE — Patient Instructions (Signed)
Please see your appointment listed below. Please call our office if you have questions or concerns. 

## 2016-05-17 ENCOUNTER — Observation Stay
Admission: EM | Admit: 2016-05-17 | Discharge: 2016-05-18 | Disposition: A | Discharge status: Home / Self Care | Payer: Self-pay | Attending: Internal Medicine | Admitting: Internal Medicine

## 2016-05-17 ENCOUNTER — Emergency Department: Payer: Self-pay

## 2016-05-17 DIAGNOSIS — N179 Acute kidney failure, unspecified: Principal | ICD-10-CM | POA: Insufficient documentation

## 2016-05-17 DIAGNOSIS — E860 Dehydration: Secondary | ICD-10-CM | POA: Insufficient documentation

## 2016-05-17 DIAGNOSIS — Z79891 Long term (current) use of opiate analgesic: Secondary | ICD-10-CM | POA: Insufficient documentation

## 2016-05-17 DIAGNOSIS — F17200 Nicotine dependence, unspecified, uncomplicated: Secondary | ICD-10-CM | POA: Insufficient documentation

## 2016-05-17 DIAGNOSIS — R7401 Elevation of levels of liver transaminase levels: Secondary | ICD-10-CM

## 2016-05-17 DIAGNOSIS — R740 Nonspecific elevation of levels of transaminase and lactic acid dehydrogenase [LDH]: Secondary | ICD-10-CM

## 2016-05-17 DIAGNOSIS — L0231 Cutaneous abscess of buttock: Secondary | ICD-10-CM | POA: Insufficient documentation

## 2016-05-17 DIAGNOSIS — F319 Bipolar disorder, unspecified: Secondary | ICD-10-CM | POA: Insufficient documentation

## 2016-05-17 DIAGNOSIS — Z791 Long term (current) use of non-steroidal anti-inflammatories (NSAID): Secondary | ICD-10-CM | POA: Insufficient documentation

## 2016-05-17 HISTORY — DX: Acute kidney failure, unspecified: N17.9

## 2016-05-17 LAB — URINALYSIS COMPLETE WITH MICROSCOPIC (ARMC ONLY)
Bilirubin Urine: NEGATIVE
GLUCOSE, UA: NEGATIVE mg/dL
Leukocytes, UA: NEGATIVE
NITRITE: NEGATIVE
PROTEIN: 100 mg/dL — AB
SPECIFIC GRAVITY, URINE: 1.019 (ref 1.005–1.030)
pH: 6 (ref 5.0–8.0)

## 2016-05-17 LAB — CBC WITH DIFFERENTIAL/PLATELET
Basophils Absolute: 0.1 10*3/uL (ref 0–0.1)
Basophils Relative: 1 %
EOS PCT: 0 %
Eosinophils Absolute: 0 10*3/uL (ref 0–0.7)
HEMATOCRIT: 45.9 % (ref 35.0–47.0)
HEMOGLOBIN: 16 g/dL (ref 12.0–16.0)
LYMPHS ABS: 0.9 10*3/uL — AB (ref 1.0–3.6)
Lymphocytes Relative: 8 %
MCH: 30 pg (ref 26.0–34.0)
MCHC: 35 g/dL (ref 32.0–36.0)
MCV: 85.9 fL (ref 80.0–100.0)
MONOS PCT: 2 %
Monocytes Absolute: 0.3 10*3/uL (ref 0.2–0.9)
Neutro Abs: 9.5 10*3/uL — ABNORMAL HIGH (ref 1.4–6.5)
Neutrophils Relative %: 89 %
Platelets: 279 10*3/uL (ref 150–440)
RBC: 5.34 MIL/uL — AB (ref 3.80–5.20)
RDW: 13.5 % (ref 11.5–14.5)
WBC: 10.8 10*3/uL (ref 3.6–11.0)

## 2016-05-17 LAB — COMPREHENSIVE METABOLIC PANEL
ALT: 195 U/L — AB (ref 14–54)
AST: 233 U/L — AB (ref 15–41)
Albumin: 4 g/dL (ref 3.5–5.0)
Alkaline Phosphatase: 94 U/L (ref 38–126)
Anion gap: 11 (ref 5–15)
BILIRUBIN TOTAL: 0.5 mg/dL (ref 0.3–1.2)
BUN: 23 mg/dL — AB (ref 6–20)
CO2: 21 mmol/L — ABNORMAL LOW (ref 22–32)
CREATININE: 1.3 mg/dL — AB (ref 0.44–1.00)
Calcium: 9.7 mg/dL (ref 8.9–10.3)
Chloride: 98 mmol/L — ABNORMAL LOW (ref 101–111)
GFR calc Af Amer: >60 mL/min (ref >60)
GFR, EST NON AFRICAN AMERICAN: 56 mL/min — AB (ref >60)
Glucose, Bld: 102 mg/dL — ABNORMAL HIGH (ref 65–99)
Potassium: 4.5 mmol/L (ref 3.5–5.1)
Sodium: 130 mmol/L — ABNORMAL LOW (ref 135–145)
TOTAL PROTEIN: 10 g/dL — AB (ref 6.5–8.1)

## 2016-05-17 LAB — LACTIC ACID, PLASMA
Lactic Acid, Venous: 1.8 mmol/L (ref 0.5–1.9)
Lactic Acid, Venous: 1.8 mmol/L (ref 0.5–1.9)

## 2016-05-17 LAB — CK: Total CK: 75 U/L (ref 38–234)

## 2016-05-17 LAB — HCG, QUANTITATIVE, PREGNANCY

## 2016-05-17 LAB — MONONUCLEOSIS SCREEN: Mono Screen: NEGATIVE

## 2016-05-17 MED ORDER — ONDANSETRON HCL 4 MG PO TABS: 4.0000 mg | ORAL_TABLET | Freq: Four times a day (QID) | ORAL | Status: DC | PRN

## 2016-05-17 MED ORDER — OXYCODONE HCL 5 MG PO TABS: 5.0000 mg | ORAL_TABLET | ORAL | Status: DC | PRN

## 2016-05-17 MED ORDER — VANCOMYCIN HCL IN DEXTROSE 1-5 GM/200ML-% IV SOLN: 1000.0000 mg | Freq: Once | INTRAVENOUS | Status: DC

## 2016-05-17 MED ORDER — ACETAMINOPHEN 650 MG RE SUPP: 650.0000 mg | Freq: Four times a day (QID) | RECTAL | Status: DC | PRN

## 2016-05-17 MED ORDER — CLINDAMYCIN HCL 150 MG PO CAPS
300.0000 mg | ORAL_CAPSULE | Freq: Three times a day (TID) | ORAL | Status: DC
  Administered 2016-05-17 – 2016-05-18 (×3): 300 mg via ORAL
  Filled 2016-05-17 (×2): qty 2
  Filled 2016-05-17 (×2): qty 1
  Filled 2016-05-17 (×3): qty 2

## 2016-05-17 MED ORDER — SODIUM CHLORIDE 0.9 % IV BOLUS (SEPSIS)
1000.0000 mL | Freq: Once | INTRAVENOUS | Status: AC
  Administered 2016-05-17: 1000 mL via INTRAVENOUS

## 2016-05-17 MED ORDER — SODIUM CHLORIDE 0.9 % IV SOLN
INTRAVENOUS | Status: DC
  Administered 2016-05-17 – 2016-05-18 (×2): via INTRAVENOUS

## 2016-05-17 MED ORDER — ACETAMINOPHEN 325 MG PO TABS
650.0000 mg | ORAL_TABLET | Freq: Four times a day (QID) | ORAL | Status: DC | PRN
  Administered 2016-05-17: 650 mg via ORAL
  Filled 2016-05-17: qty 2

## 2016-05-17 MED ORDER — PIPERACILLIN-TAZOBACTAM 3.375 G IVPB 30 MIN
3.3750 g | Freq: Once | INTRAVENOUS | Status: AC
  Administered 2016-05-17: 3.375 g via INTRAVENOUS
  Filled 2016-05-17: qty 50

## 2016-05-17 MED ORDER — ONDANSETRON HCL 4 MG/2ML IJ SOLN: 4.0000 mg | Freq: Four times a day (QID) | INTRAMUSCULAR | Status: DC | PRN

## 2016-05-17 MED ORDER — ENOXAPARIN SODIUM 40 MG/0.4ML ~~LOC~~ SOLN
40.0000 mg | SUBCUTANEOUS | Status: DC
  Administered 2016-05-17: 40 mg via SUBCUTANEOUS
  Filled 2016-05-17: qty 0.4

## 2016-05-17 NOTE — ED Triage Notes (Signed)
Pt c/o generalized body aches with N/V since Saturday, states she had an abscess surgically debreeded last Monday on the buttock, states that seemed to be healing well but is just not feeling well.

## 2016-05-17 NOTE — ED Notes (Signed)
Report to Maralyn SagoSarah, will draw lactic acid and then send to floor.

## 2016-05-17 NOTE — H&P (Signed)
[facility] [place] [facility]   PATIENT NAME: Desiree Young    MR#:  [id number]  DATE OF BIRTH:  12/02/1990   DATE OF ADMISSION:  05/17/2016  PRIMARY CARE PHYSICIAN: No PCP Per Patient   REQUESTING/REFERRING PHYSICIAN: Lord  CHIEF COMPLAINT:   Chief Complaint  Patient presents with  . Generalized Body Aches  . Emesis  . Fever    HISTORY OF PRESENT ILLNESS:  Desiree Young  is a 25 y.o. female with a known history of Recent abscess status post incision and drainage performed 8/16 who is presenting with subjective fevers chills, nausea vomiting as well as malaise. No further localizing factors seems that abscess site no longer painful no further drainage. She is on Bactrim has 10 days left of antibiotics. Noted to have mild acute kidney injury given symptoms and tachycardia concern in emergency department requesting admission. Denies sick contacts PAST MEDICAL HISTORY:   Past Medical History:  Diagnosis Date  . Bipolar 1 disorder (HCC)     PAST SURGICAL HISTORY:   Past Surgical History:  Procedure Laterality Date  . INCISION AND DRAINAGE PERIRECTAL ABSCESS  05/10/2013  . KELOID EXCISION     Ear    SOCIAL HISTORY:   Social History  Substance Use Topics  . Smoking status: Current Every Day Smoker  . Smokeless tobacco: Never Used  . Alcohol use Yes    FAMILY HISTORY:   Family History  Problem Relation Age of Onset  . Hypertension Father   . Hypertension Maternal Grandmother   . Hypertension Paternal Grandmother   . Diabetes Paternal Grandmother   . Thyroid disease Paternal Grandmother     DRUG ALLERGIES:  No Known Allergies  REVIEW OF SYSTEMS:  REVIEW OF SYSTEMS:  CONSTITUTIONAL: Positive fevers, chills, fatigue, weakness.  EYES: Denies blurred vision, double vision, or eye pain.  EARS, NOSE, THROAT: Denies tinnitus, ear pain, hearing loss.  RESPIRATORY: denies cough, shortness of breath, wheezing  CARDIOVASCULAR: Denies chest pain,  palpitations, edema.  GASTROINTESTINAL: Positive nausea, vomiting, denies diarrhea, abdominal pain.  GENITOURINARY: Denies dysuria, hematuria.  ENDOCRINE: Denies nocturia or thyroid problems. HEMATOLOGIC AND LYMPHATIC: Denies easy bruising or bleeding.  SKIN: Denies rash or lesions.  MUSCULOSKELETAL: Denies pain in neck, back, shoulder, knees, hips, or further arthritic symptoms.  NEUROLOGIC: Denies paralysis, paresthesias.  PSYCHIATRIC: Denies anxiety or depressive symptoms. Otherwise full review of systems performed by me is negative.   MEDICATIONS AT HOME:   Prior to Admission medications   Medication Sig Start Date End Date Taking? Authorizing Provider  ibuprofen (ADVIL,MOTRIN) 200 MG tablet Take 200 mg by mouth every 6 (six) hours as needed.   Yes Historical Provider, MD  sulfamethoxazole-trimethoprim (BACTRIM DS,SEPTRA DS) 800-160 MG tablet Take 1 tablet by mouth 2 (two) times daily. Patient taking differently: Take 1 tablet by mouth 2 (two) times daily. Taking for 10 more days but states that she doesn't know if this is making her sick 05/12/16  Yes Lattie Hawichard E Cooper, MD  HYDROcodone-acetaminophen (NORCO/VICODIN) 5-325 MG tablet Take 1 tablet by mouth every 4 (four) hours as needed for moderate pain. Patient not taking: Reported on 05/17/2016 05/12/16   Lattie Hawichard E Cooper, MD      VITAL SIGNS:  Blood pressure (!) 124/95, pulse 94, temperature 99.9 F (37.7 C), temperature source Oral, resp. rate 16, height 5\' 5"  (1.651 m), weight 71.2 kg (157 lb), last menstrual period 05/14/2016, SpO2 100 %.  PHYSICAL EXAMINATION:  VITAL SIGNS: Vitals:   05/17/16 1356 05/17/16  1550  BP: 126/81 (!) 124/95  Pulse: (!) 109 94  Resp: 18 16  Temp: 99.9 F (37.7 C)    GENERAL:25 y.o.female currently in no acute distress.  HEAD: Normocephalic, atraumatic.  EYES: Pupils equal, round, reactive to light. Extraocular muscles intact. No scleral icterus.  MOUTH: Moist mucosal membrane. Dentition intact.  No abscess noted.  EAR, NOSE, THROAT: Clear without exudates. No external lesions.  NECK: Supple. No thyromegaly. No nodules. No JVD.  PULMONARY: Clear to ascultation, without wheeze rails or rhonci. No use of accessory muscles, Good respiratory effort. good air entry bilaterally CHEST: Nontender to palpation.  CARDIOVASCULAR: S1 and S2. Regular rate and rhythm. No murmurs, rubs, or gallops. No edema. Pedal pulses 2+ bilaterally.  GASTROINTESTINAL: Soft, nontender, nondistended. No masses. Positive bowel sounds. No hepatosplenomegaly.  MUSCULOSKELETAL: No swelling, clubbing, or edema. Range of motion full in all extremities.  NEUROLOGIC: Cranial nerves II through XII are intact. No gross focal neurological deficits. Sensation intact. Reflexes intact.  SKIN: No ulceration, lesions, rashes, or cyanosis. Skin warm and dry. Turgor intact.  PSYCHIATRIC: Mood, affect within normal limits. The patient is awake, alert and oriented x 3. Insight, judgment intact.    LABORATORY PANEL:   CBC  Recent Labs Lab 05/17/16 1400  WBC 10.8  HGB 16.0  HCT 45.9  PLT 279   ------------------------------------------------------------------------------------------------------------------  Chemistries   Recent Labs Lab 05/17/16 1400  NA 130*  K 4.5  CL 98*  CO2 21*  GLUCOSE 102*  BUN 23*  CREATININE 1.30*  CALCIUM 9.7  AST 233*  ALT 195*  ALKPHOS 94  BILITOT 0.5   ------------------------------------------------------------------------------------------------------------------  Cardiac Enzymes No results for input(s): TROPONINI in the last 168 hours. ------------------------------------------------------------------------------------------------------------------  RADIOLOGY:  Dg Chest 2 View  Result Date: 05/17/2016 CLINICAL DATA:  Body aches and chills occasional vomiting for the past 4 days. No chest complaints. Current 1/2 pack a day smoker. EXAM: CHEST  2 VIEW COMPARISON:  None in  PACs FINDINGS: The lungs are well-expanded and clear. The heart and pulmonary vascularity are normal. The mediastinum is normal in width. There is no pleural effusion. The bony thorax is unremarkable. IMPRESSION: There is no active cardiopulmonary disease. Electronically Signed   By: David  Swazil[place]Jordan M.D.   On: 05/17/2016 15:29    EKG:  No orders found for this or any previous visit.  IMPRESSION AND PLAN:   25 year old African-American female without sniffing medical history of recent incision and drainage of abscess performed 05/12/2016 percent with generalized malaise and other symptoms 1. Acute kidney injury: Likely in relation to dehydration as well as Bactrim therapy, hold nephrotoxic agents, IV fluid hydration and follow urine output renal function 2. Abscess: Currently on Bactrim. Bactrim given the acute kidney injury received Zosyn in emergency department seems a bit overkill for current infection can continue with clindamycin-patient does not meet septic criteria on presentation 3. Hyponatremia: IV fluid hydration 4. Mild transaminitis no abdominal symptoms follow,    All the records are reviewed and case discussed with ED provider. Management plans discussed with the patient, family and they are in agreement.  CODE STATUS: Full  TOTAL TIME TAKING CARE OF THIS PATIENT: 33 minutes.    Hower,  Mardi MainlandDavid K M.D on 05/17/2016 at 4:20 PM  Between 7am to 6pm - Pager - [phone]  After 6pm: House Pager: - [phone]  [facility] [place] [facility]  Office  [phone]  CC: Primary care physician; No PCP Per Patient

## 2016-05-17 NOTE — ED Notes (Signed)
Pt is on her menstrual cycle

## 2016-05-17 NOTE — ED Provider Notes (Signed)
[facility] Emergency Department Provider Note ____________________________________________   I have reviewed the triage vital signs and the triage nursing note.  HISTORY  Chief Complaint Generalized Body Aches; Emesis; and Fever   Historian Patient  HPI Desiree Young is a 25 y.o. female who is here for chills and muscle aches all over. No documented fever. No coughing. Yesterday she did have some discomfort in her throat, but no real sore throat now. No abdominal pain.  She did have a pilonidal cyst lanced/drained in the office with Dr. Excell Seltzer on Monday.  Symptoms started Friday.  She has been on bactrim.  Abscess has resolved.  No tenderness of rash in that area.  Symptoms of fatigue and muscle aches are moderate, nothing makes it worse or better.    Past Medical History:  Diagnosis Date  . Bipolar 1 disorder (HCC)     There are no active problems to display for this patient.   Past Surgical History:  Procedure Laterality Date  . INCISION AND DRAINAGE PERIRECTAL ABSCESS  05/10/2013  . KELOID EXCISION     Ear    Prior to Admission medications   Medication Sig Start Date End Date Taking? Authorizing Provider  HYDROcodone-acetaminophen (NORCO/VICODIN) 5-325 MG tablet Take 1 tablet by mouth every 4 (four) hours as needed for moderate pain. 05/12/16   Lattie Haw, MD  sulfamethoxazole-trimethoprim (BACTRIM DS,SEPTRA DS) 800-160 MG tablet Take 1 tablet by mouth 2 (two) times daily. 05/12/16   Lattie Haw, MD    No Known Allergies  Family History  Problem Relation Age of Onset  . Hypertension Father   . Hypertension Maternal Grandmother   . Hypertension Paternal Grandmother   . Diabetes Paternal Grandmother   . Thyroid disease Paternal Grandmother     Social History Social History  Substance Use Topics  . Smoking status: Current Every Day Smoker  . Smokeless tobacco: Never Used  . Alcohol use Yes    Review of  Systems  Constitutional: Negative for fever. Positive for chills. Eyes: Negative for visual changes. ENT: Negative for sore throat, currently no sore throat. Cardiovascular: Negative for chest pain. Respiratory: Negative for shortness of breath. Gastrointestinal: Negative for abdominal pain, vomiting and diarrhea. Genitourinary: Negative for dysuria. Musculoskeletal: Negative for back pain. Skin: Negative for rash. Neurological: Negative for headache. 10 point Review of Systems otherwise negative ____________________________________________   PHYSICAL EXAM:  VITAL SIGNS: ED Triage Vitals  Enc Vitals Group     BP 05/17/16 1356 126/81     Pulse Rate 05/17/16 1356 (!) 109     Resp 05/17/16 1356 18     Temp 05/17/16 1356 99.9 F (37.7 C)     Temp Source 05/17/16 1356 Oral     SpO2 05/17/16 1356 100 %     Weight 05/17/16 1356 157 lb (71.2 kg)     Height 05/17/16 1356 5\' 5"  (1.651 m)     Head Circumference --      Peak Flow --      Pain Score 05/17/16 1357 10     Pain Loc --      Pain Edu? --      Excl. in GC? --      Constitutional: Alert and oriented. Well appearing and in no distress. HEENT   Head: Normocephalic and atraumatic.      Eyes: Conjunctivae are normal. PERRL. Normal extraocular movements.      Ears:         Nose: No congestion/rhinnorhea.  Mouth/Throat: Mucous membranes are moist.   Neck: No stridor. Cardiovascular/Chest: Normal rate, regular rhythm.  No murmurs, rubs, or gallops. Respiratory: Normal respiratory effort without tachypnea nor retractions. Breath sounds are clear and equal bilaterally. No wheezes/rales/rhonchi. Gastrointestinal: Soft. No distention, no guarding, no rebound. Nontender.    Genitourinary/rectal: healing scab at site of abscess drainage at right buttock, no fluctance, redness or tenderness. Musculoskeletal: Nontender with normal range of motion in all extremities. No joint effusions.  No lower extremity tenderness.  No  edema. Neurologic:  Normal speech and language. No gross or focal neurologic deficits are appreciated. Skin:  Skin is warm, dry and intact. No rash noted. Psychiatric: Mood and affect are normal. Speech and behavior are normal. Patient exhibits appropriate insight and judgment.  ____________________________________________   EKG I, Governor Rooksebecca Tonnie Friedel, MD, the attending physician have personally viewed and interpreted all ECGs.  None ____________________________________________  LABS (pertinent positives/negatives)  Labs Reviewed  COMPREHENSIVE METABOLIC PANEL - Abnormal; Notable for the following:       Result Value   Sodium 130 (*)    Chloride 98 (*)    CO2 21 (*)    Glucose, Bld 102 (*)    BUN 23 (*)    Creatinine, Ser 1.30 (*)    Total Protein 10.0 (*)    AST 233 (*)    ALT 195 (*)    GFR calc non Af Amer 56 (*)    All other components within normal limits  CBC WITH DIFFERENTIAL/PLATELET - Abnormal; Notable for the following:    RBC 5.34 (*)    Neutro Abs 9.5 (*)    Lymphs Abs 0.9 (*)    All other components within normal limits  CULTURE, BLOOD (ROUTINE X 2)  CULTURE, BLOOD (ROUTINE X 2)  URINE CULTURE  HCG, QUANTITATIVE, PREGNANCY  LACTIC ACID, PLASMA  LACTIC ACID, PLASMA  URINALYSIS COMPLETEWITH MICROSCOPIC ([facility] ONLY)    ____________________________________________  RADIOLOGY All Xrays were viewed by me. Imaging interpreted by Radiologist.  Chest xray: Negative __________________________________________  PROCEDURES  Procedure(s) performed: None  Critical Care performed: None  ____________________________________________   ED COURSE / ASSESSMENT AND PLAN  Pertinent labs & imaging results that were available during my care of the patient were reviewed by me and considered in my medical decision making (see chart for details).   Ms. Desiree Young is here and has body aches and chills concerning for possible bacteremia/sepsis. She has an elevated heart rate  around 110, but no hypotension. She does have acute renal failure with out elevated lactate.  No source at this point in time, but I am going to go ahead and cover her with a grandson and Zosyn for undifferentiated possible sepsis given that she is flushed and looks like she doesn't feel well, and has body aches all over as well as chills even though she doesn't have a documented fever here.  White blood cell count is 10, but she does have a left shift. She has recently been on antibiotics, Bactrim for this pilonidal cyst, and that area looks fine.  Ultimately, I think that she needs close observation and hydration and treatment until blood cultures are returned.  CONSULTATIONS:  Hospitalist for observation admission.   Patient / Family / Caregiver informed of clinical course, medical decision-making process, and agree with plan.     ___________________________________________   FINAL CLINICAL IMPRESSION(S) / ED DIAGNOSES   Final diagnoses:  Acute renal failure, unspecified acute renal failure type (HCC)  Transaminitis  Note: This dictation was prepared with Dragon dictation. Any transcriptional errors that result from this process are unintentional    Governor Rooksebecca Sheika Coutts, MD 05/17/16 1540

## 2016-05-18 LAB — CBC
HCT: 39 % (ref 35.0–47.0)
Hemoglobin: 13.7 g/dL (ref 12.0–16.0)
MCH: 30.6 pg (ref 26.0–34.0)
MCHC: 35.2 g/dL (ref 32.0–36.0)
MCV: 87.1 fL (ref 80.0–100.0)
PLATELETS: 214 10*3/uL (ref 150–440)
RBC: 4.48 MIL/uL (ref 3.80–5.20)
RDW: 13.3 % (ref 11.5–14.5)
WBC: 5.9 10*3/uL (ref 3.6–11.0)

## 2016-05-18 LAB — COMPREHENSIVE METABOLIC PANEL
ALBUMIN: 3.3 g/dL — AB (ref 3.5–5.0)
ALK PHOS: 82 U/L (ref 38–126)
ALT: 160 U/L — AB (ref 14–54)
ANION GAP: 6 (ref 5–15)
AST: 198 U/L — AB (ref 15–41)
BILIRUBIN TOTAL: 0.3 mg/dL (ref 0.3–1.2)
BUN: 14 mg/dL (ref 6–20)
CALCIUM: 8.6 mg/dL — AB (ref 8.9–10.3)
CO2: 21 mmol/L — ABNORMAL LOW (ref 22–32)
CREATININE: 1.08 mg/dL — AB (ref 0.44–1.00)
Chloride: 108 mmol/L (ref 101–111)
GFR calc Af Amer: >60 mL/min (ref >60)
GFR calc non Af Amer: >60 mL/min (ref >60)
GLUCOSE: 83 mg/dL (ref 65–99)
Potassium: 4.2 mmol/L (ref 3.5–5.1)
Sodium: 135 mmol/L (ref 135–145)
TOTAL PROTEIN: 7.6 g/dL (ref 6.5–8.1)

## 2016-05-18 LAB — URINE CULTURE

## 2016-05-18 MED ORDER — CLINDAMYCIN HCL 300 MG PO CAPS: 300.0000 mg | ORAL_CAPSULE | Freq: Three times a day (TID) | ORAL | 0 refills | Status: DC

## 2016-05-18 NOTE — Discharge Instructions (Signed)

## 2016-05-19 NOTE — Discharge Summary (Signed)
[facility] -  at [facility]   PATIENT NAME: Desiree Young    MR#:  [id number]  DATE OF BIRTH:  06/21/1991  DATE OF ADMISSION:  05/17/2016 ADMITTING PHYSICIAN: Wyatt Hasteavid K Hower, MD  DATE OF DISCHARGE: 05/18/2016  1:46 PM  PRIMARY CARE PHYSICIAN: No PCP Per Patient   ADMISSION DIAGNOSIS:  Transaminitis [R74.0] Acute renal failure, unspecified acute renal failure type (HCC) [N17.9]  DISCHARGE DIAGNOSIS:  Active Problems:   AKI (acute kidney injury) (HCC)   SECONDARY DIAGNOSIS:   Past Medical History:  Diagnosis Date  . Bipolar 1 disorder (HCC)      ADMITTING HISTORY  HISTORY OF PRESENT ILLNESS:  Desiree Young  is a 25 y.o. female with a known history of Recent abscess status post incision and drainage performed 8/16 who is presenting with subjective fevers chills, nausea vomiting as well as malaise. No further localizing factors seems that abscess site no longer painful no further drainage. She is on Bactrim has 10 days left of antibiotics. Noted to have mild acute kidney injury given symptoms and tachycardia concern in emergency department requesting admission. Denies sick contacts   HOSPITAL COURSE:   * Vomiting and diarrhea likely gastroenteritis or reaction to Bactrim. Patient had dehydration. Manage symptomatically. Symptoms resolved completely by the day of discharge with supportive care.  * Buttock abscess with recent I&D. Patient's Bactrim is being changed to clindamycin for 1 more week. Follow-up with surgery as outpatient.  Stable for discharge home.  CONSULTS OBTAINED:  Treatment Team:  Wyatt Hasteavid K Hower, MD  DRUG ALLERGIES:  No Known Allergies  DISCHARGE MEDICATIONS:   Discharge Medication List as of 05/18/2016 12:49 PM    START taking these medications   Details  clindamycin (CLEOCIN) 300 MG capsule Take 1 capsule (300 mg total) by mouth 3 (three) times daily., Starting Tue 05/18/2016, Print      CONTINUE these medications  which have NOT CHANGED   Details  ibuprofen (ADVIL,MOTRIN) 200 MG tablet Take 200 mg by mouth every 6 (six) hours as needed., Historical Med    HYDROcodone-acetaminophen (NORCO/VICODIN) 5-325 MG tablet Take 1 tablet by mouth every 4 (four) hours as needed for moderate pain., Starting Wed 05/12/2016, Print      STOP taking these medications     sulfamethoxazole-trimethoprim (BACTRIM DS,SEPTRA DS) 800-160 MG tablet         Today   VITAL SIGNS:  Blood pressure 108/64, pulse 86, temperature 98.2 F (36.8 C), temperature source Oral, resp. rate 18, height 5\' 5"  (1.651 m), weight 71.2 kg (157 lb), last menstrual period 05/14/2016, SpO2 98 %.  I/O:  No intake or output data in the 24 hours ending 05/19/16 1441  PHYSICAL EXAMINATION:  Physical Exam  GENERAL:  25 y.o.-year-old patient lying in the bed with no acute distress.  LUNGS: Normal breath sounds bilaterally, no wheezing, rales,rhonchi or crepitation. No use of accessory muscles of respiration.  CARDIOVASCULAR: S1, S2 normal. No murmurs, rubs, or gallops.  ABDOMEN: Soft, non-tender, non-distended. Bowel sounds present. No organomegaly or mass.  NEUROLOGIC: Moves all 4 extremities. PSYCHIATRIC: The patient is alert and oriented x 3.  SKIN: No obvious rash, lesion, or ulcer.  Dressing over the buttock abscess  DATA REVIEW:   CBC  Recent Labs Lab 05/18/16 0524  WBC 5.9  HGB 13.7  HCT 39.0  PLT 214    Chemistries   Recent Labs Lab 05/18/16 0524  NA 135  K 4.2  CL 108  CO2 21*  GLUCOSE 83  BUN 14  CREATININE 1.08*  CALCIUM 8.6*  AST 198*  ALT 160*  ALKPHOS 82  BILITOT 0.3    Cardiac Enzymes No results for input(s): TROPONINI in the last 168 hours.  Microbiology Results  Results for orders placed or performed during the hospital encounter of 05/17/16  Culture, blood (Routine x 2)     Status: None (Preliminary result)   Collection Time: 05/17/16  2:00 PM  Result Value Ref Range Status   Specimen  Description BLOOD RIGHT AC  Final   Special Requests   Final    BOTTLES DRAWN AEROBIC AND ANAEROBIC AER 2ML ANA 3ML   Culture NO GROWTH 2 DAYS  Final   Report Status PENDING  Incomplete  Culture, blood (Routine x 2)     Status: None (Preliminary result)   Collection Time: 05/17/16  2:00 PM  Result Value Ref Range Status   Specimen Description BLOOD LEFT FA  Final   Special Requests   Final    BOTTLES DRAWN AEROBIC AND ANAEROBIC AER 3ML ANA 2ML   Culture NO GROWTH 2 DAYS  Final   Report Status PENDING  Incomplete  Urine culture     Status: Abnormal   Collection Time: 05/17/16  3:47 PM  Result Value Ref Range Status   Specimen Description URINE, RANDOM  Final   Special Requests NONE  Final   Culture MULTIPLE SPECIES PRESENT, SUGGEST RECOLLECTION (A)  Final   Report Status 05/18/2016 FINAL  Final    RADIOLOGY:  Dg Chest 2 View  Result Date: 05/17/2016 CLINICAL DATA:  Body aches and chills occasional vomiting for the past 4 days. No chest complaints. Current 1/2 pack a day smoker. EXAM: CHEST  2 VIEW COMPARISON:  None in PACs FINDINGS: The lungs are well-expanded and clear. The heart and pulmonary vascularity are normal. The mediastinum is normal in width. There is no pleural effusion. The bony thorax is unremarkable. IMPRESSION: There is no active cardiopulmonary disease. Electronically Signed   By: David  SwazilandJordan M.D.   On: 05/17/2016 15:29    Follow up with PCP in 1 week.  Management plans discussed with the patient, family and they are in agreement.  CODE STATUS:  Code Status History    Date Active Date Inactive Code Status Order ID Comments User Context   05/17/2016  4:06 PM 05/18/2016  4:51 PM Full Code [id number]  Wyatt Hasteavid K Hower, MD ED      TOTAL TIME TAKING CARE OF THIS PATIENT ON DAY OF DISCHARGE: more than 30 minutes.   Milagros LollSudini, Kaylyn Garrow R M.D on 05/19/2016 at 2:41 PM  Between 7am to 6pm - Pager - [phone]  After 6pm go to [email or web address] - password [email or web address] [facility]  Office  [phone]  CC: Primary care physician; No PCP Per Patient  Note: This dictation was prepared with Dragon dictation along with smaller phrase technology. Any transcriptional errors that result from this process are unintentional.

## 2016-05-22 LAB — CULTURE, BLOOD (ROUTINE X 2)
CULTURE: NO GROWTH
Culture: NO GROWTH

## 2016-05-24 ENCOUNTER — Encounter: Payer: Self-pay | Admitting: Surgery

## 2016-05-24 ENCOUNTER — Ambulatory Visit (INDEPENDENT_AMBULATORY_CARE_PROVIDER_SITE_OTHER): Payer: Self-pay | Admitting: Surgery

## 2016-05-24 VITALS — BP 137/81 | HR 80 | Temp 98.1°F | Wt 160.0 lb

## 2016-05-24 DIAGNOSIS — L0501 Pilonidal cyst with abscess: Secondary | ICD-10-CM

## 2016-05-24 MED ORDER — DOXYCYCLINE HYCLATE 50 MG PO CAPS: 50.0000 mg | ORAL_CAPSULE | Freq: Two times a day (BID) | ORAL | 0 refills | Status: AC

## 2016-05-24 MED ORDER — DOXYCYCLINE HYCLATE 50 MG PO CAPS: 50.0000 mg | ORAL_CAPSULE | Freq: Two times a day (BID) | ORAL | Status: DC

## 2016-05-24 NOTE — Patient Instructions (Addendum)
Stop taking Clindamycin and start your new medication.  Please schedule an appointment with a primary care doctor.  Please drinking plenty of fluids: water and/or Gatorade. Stay hydrated.

## 2016-05-24 NOTE — Progress Notes (Signed)
Outpatient Surgical Follow Up  05/24/2016  Desiree Young is an 25 y.o. female.   CC: Status post I&D of large pilonidal cyst with abscess  HPI: This patient had an I&D of a large pilonidal abscess 12 days ago. Concerning her buttock area she feels much better no drainage no pain. She was in the emergency room and was dehydrated with a creatinine of 1.08 and was told that it could be related to the Bactrim. She was changed to clindamycin. Now she is having itching and I burning that she thinks may be related to the clindamycin.  Past Medical History:  Diagnosis Date  . Bipolar 1 disorder [facility](HCC)     Past Surgical History:  Procedure Laterality Date  . INCISION AND DRAINAGE PERIRECTAL ABSCESS  05/10/2013  . KELOID EXCISION     Ear    Family History  Problem Relation Age of Onset  . Hypertension Father   . Hypertension Maternal Grandmother   . Hypertension Paternal Grandmother   . Diabetes Paternal Grandmother   . Thyroid disease Paternal Grandmother     Social History:  reports that she has been smoking.  She has never used smokeless tobacco. She reports that she drinks alcohol. Her drug history is not on file.  Allergies: No Known Allergies  Medications reviewed.   Review of Systems:   Review of Systems  Constitutional: Negative for chills and fever.  HENT: Negative for hearing loss.   Eyes: Positive for redness. Negative for blurred vision.  Respiratory: Negative.   Cardiovascular: Negative.   Gastrointestinal: Negative.   Genitourinary: Negative.   Musculoskeletal: Negative.   Skin: Positive for itching and rash.  Neurological: Negative.  Negative for headaches.  Endo/Heme/Allergies: Negative.   Psychiatric/Behavioral: Negative.      Physical Exam:  LMP 05/14/2016   Physical Exam  Constitutional: She is oriented to person, place, and time and well-developed, well-nourished, and in no distress. No distress.  HENT:  Head: Normocephalic and atraumatic.   Eyes: Right eye exhibits discharge. Left eye exhibits discharge. No scleral icterus.  Neck: Normal range of motion.  Musculoskeletal: Normal range of motion.  Pilonidal area without erythema drainage or tenderness wounds are healed  Lymphadenopathy:    She has no cervical adenopathy.  Neurological: She is alert and oriented to person, place, and time.  Skin: Skin is warm and dry. No rash noted. She is not diaphoretic. No erythema.      No results found for this or any previous visit (from the past 48 hour(s)). No results found.  Assessment/Plan:  Pilonidal abscess which is resolved. She's doing well. I did discuss with her the fact that this will recur with all likelihood and that referral to a plastic surgeon for flap closure and excision of this area is warranted. [facility] and [facility] will [facility].  . She has experienced itching while on clindamycin and I will change her to doxycycline she was taken off of the Bactrim bleeding that he could've contributed to her mild renal failure 1.08 creatinine  As [facility] will [facility] she can follow up with us on an as-needed basis  Lattie Hawichard E Female Minish, MD, FACS

## 2017-04-04 ENCOUNTER — Emergency Department
Admission: EM | Admit: 2017-04-04 | Discharge: 2017-04-04 | Disposition: A | Discharge status: Home / Self Care | Payer: Self-pay | Attending: Emergency Medicine | Admitting: Emergency Medicine

## 2017-04-04 ENCOUNTER — Encounter: Payer: Self-pay | Admitting: Emergency Medicine

## 2017-04-04 DIAGNOSIS — F17210 Nicotine dependence, cigarettes, uncomplicated: Secondary | ICD-10-CM | POA: Insufficient documentation

## 2017-04-04 DIAGNOSIS — Z792 Long term (current) use of antibiotics: Secondary | ICD-10-CM | POA: Insufficient documentation

## 2017-04-04 DIAGNOSIS — R52 Pain, unspecified: Secondary | ICD-10-CM | POA: Insufficient documentation

## 2017-04-04 DIAGNOSIS — R509 Fever, unspecified: Secondary | ICD-10-CM | POA: Insufficient documentation

## 2017-04-04 LAB — CBC WITH DIFFERENTIAL/PLATELET
BASOS ABS: 0.1 10*3/uL (ref 0–0.1)
BASOS PCT: 1 %
Eosinophils Absolute: 0 10*3/uL (ref 0–0.7)
Eosinophils Relative: 0 %
HEMATOCRIT: 42.5 % (ref 35.0–47.0)
Hemoglobin: 14.7 g/dL (ref 12.0–16.0)
Lymphocytes Relative: 14 %
Lymphs Abs: 1.6 10*3/uL (ref 1.0–3.6)
MCH: 30.2 pg (ref 26.0–34.0)
MCHC: 34.6 g/dL (ref 32.0–36.0)
MCV: 87.5 fL (ref 80.0–100.0)
MONO ABS: 0.5 10*3/uL (ref 0.2–0.9)
Monocytes Relative: 5 %
NEUTROS ABS: 9.3 10*3/uL — AB (ref 1.4–6.5)
NEUTROS PCT: 80 %
Platelets: 202 10*3/uL (ref 150–440)
RBC: 4.86 MIL/uL (ref 3.80–5.20)
RDW: 13.6 % (ref 11.5–14.5)
WBC: 11.6 10*3/uL — AB (ref 3.6–11.0)

## 2017-04-04 LAB — URINALYSIS, COMPLETE (UACMP) WITH MICROSCOPIC
BACTERIA UA: NONE SEEN
BILIRUBIN URINE: NEGATIVE
GLUCOSE, UA: NEGATIVE mg/dL
Hgb urine dipstick: NEGATIVE
KETONES UR: NEGATIVE mg/dL
LEUKOCYTES UA: NEGATIVE
NITRITE: NEGATIVE
PH: 7 (ref 5.0–8.0)
Protein, ur: NEGATIVE mg/dL
RBC / HPF: NONE SEEN RBC/hpf (ref 0–5)
Specific Gravity, Urine: 1.002 — ABNORMAL LOW (ref 1.005–1.030)
WBC, UA: NONE SEEN WBC/hpf (ref 0–5)

## 2017-04-04 LAB — BASIC METABOLIC PANEL
ANION GAP: 7 (ref 5–15)
BUN: 6 mg/dL (ref 6–20)
CO2: 27 mmol/L (ref 22–32)
Calcium: 9.5 mg/dL (ref 8.9–10.3)
Chloride: 103 mmol/L (ref 101–111)
Creatinine, Ser: 0.86 mg/dL (ref 0.44–1.00)
Glucose, Bld: 105 mg/dL — ABNORMAL HIGH (ref 65–99)
Potassium: 4.4 mmol/L (ref 3.5–5.1)
Sodium: 137 mmol/L (ref 135–145)

## 2017-04-04 LAB — POCT PREGNANCY, URINE: Preg Test, Ur: NEGATIVE

## 2017-04-04 MED ORDER — ACETAMINOPHEN 325 MG PO TABS
650.0000 mg | ORAL_TABLET | Freq: Once | ORAL | Status: AC | PRN
  Administered 2017-04-04: 650 mg via ORAL
  Filled 2017-04-04: qty 2

## 2017-04-04 MED ORDER — DOXYCYCLINE HYCLATE 100 MG PO TABS
ORAL_TABLET | ORAL | Status: AC
  Filled 2017-04-04: qty 2

## 2017-04-04 MED ORDER — DOXYCYCLINE HYCLATE 100 MG PO CAPS: 100.0000 mg | ORAL_CAPSULE | Freq: Two times a day (BID) | ORAL | 0 refills | Status: DC

## 2017-04-04 MED ORDER — DOXYCYCLINE HYCLATE 100 MG PO TABS
200.0000 mg | ORAL_TABLET | Freq: Once | ORAL | Status: AC
  Administered 2017-04-04: 200 mg via ORAL

## 2017-04-04 NOTE — ED Notes (Signed)
Pt states 3 days ago pt had back pain that then progressed to the "whole body." Pt states body aches, denies fever however had 100.5 temp in triage which pt received tylenol. Pt states "I feel dehydrated", pt denies seeing or removing tick from self. Pt denies V/D. Pt A&O at this time.

## 2017-04-04 NOTE — Discharge Instructions (Signed)
Results for orders placed or performed during the hospital encounter of 04/04/17  CBC with Differential  Result Value Ref Range   WBC 11.6 (H) 3.6 - 11.0 K/uL   RBC 4.86 3.80 - 5.20 MIL/uL   Hemoglobin 14.7 12.0 - 16.0 g/dL   HCT 16.142.5 09.635.0 - 04.547.0 %   MCV 87.5 80.0 - 100.0 fL   MCH 30.2 26.0 - 34.0 pg   MCHC 34.6 32.0 - 36.0 g/dL   RDW 40.913.6 81.111.5 - 91.414.5 %   Platelets 202 150 - 440 K/uL   Neutrophils Relative % 80 %   Neutro Abs 9.3 (H) 1.4 - 6.5 K/uL   Lymphocytes Relative 14 %   Lymphs Abs 1.6 1.0 - 3.6 K/uL   Monocytes Relative 5 %   Monocytes Absolute 0.5 0.2 - 0.9 K/uL   Eosinophils Relative 0 %   Eosinophils Absolute 0.0 0 - 0.7 K/uL   Basophils Relative 1 %   Basophils Absolute 0.1 0 - 0.1 K/uL  Basic metabolic panel  Result Value Ref Range   Sodium 137 135 - 145 mmol/L   Potassium 4.4 3.5 - 5.1 mmol/L   Chloride 103 101 - 111 mmol/L   CO2 27 22 - 32 mmol/L   Glucose, Bld 105 (H) 65 - 99 mg/dL   BUN 6 6 - 20 mg/dL   Creatinine, Ser 7.820.86 0.44 - 1.00 mg/dL   Calcium 9.5 8.9 - 95.610.3 mg/dL   GFR calc non Af Amer >60 >60 mL/min   GFR calc Af Amer >60 >60 mL/min   Anion gap 7 5 - 15  Urinalysis, Complete w Microscopic  Result Value Ref Range   Color, Urine STRAW (A) YELLOW   APPearance CLEAR (A) CLEAR   Specific Gravity, Urine 1.002 (L) 1.005 - 1.030   pH 7.0 5.0 - 8.0   Glucose, UA NEGATIVE NEGATIVE mg/dL   Hgb urine dipstick NEGATIVE NEGATIVE   Bilirubin Urine NEGATIVE NEGATIVE   Ketones, ur NEGATIVE NEGATIVE mg/dL   Protein, ur NEGATIVE NEGATIVE mg/dL   Nitrite NEGATIVE NEGATIVE   Leukocytes, UA NEGATIVE NEGATIVE   RBC / HPF NONE SEEN 0 - 5 RBC/hpf   WBC, UA NONE SEEN 0 - 5 WBC/hpf   Bacteria, UA NONE SEEN NONE SEEN   Squamous Epithelial / LPF 0-5 (A) NONE SEEN  Pregnancy, urine POC  Result Value Ref Range   Preg Test, Ur NEGATIVE NEGATIVE   No results found.

## 2017-04-04 NOTE — ED Notes (Signed)

## 2017-04-04 NOTE — ED Triage Notes (Signed)
Pt c/o generalized body aches and headache for 3 days.  Low grade temp. Has had some chills.  No specific pain. No urinary sx. Does admit to being outside a lot recently but no know bug or tick bites. Discussed with dr Sharma Covertnorman, labs per recommendations

## 2017-04-04 NOTE — ED Provider Notes (Signed)
[facility] Emergency Department Provider Note  ____________________________________________  Time seen: Approximately 10:05 PM  I have reviewed the triage vital signs and the nursing notes.   HISTORY  Chief Complaint No chief complaint on file.    HPI Desiree Young is a 26 y.o. female who reports generalized body aches that have gradually worsened over the past 3 days. Constant. No aggravating or alleviating factors. Mild in intensity. No joint pain. No rash. No vomiting. Eating and drinking normally. The body aches include her head and neck, but no specific severe headache or neck stiffness. No vision changes numbness tingling or weakness.  Patient has been doing outdoor activity including being in the woods a week ago [place]. She has not noticed any ticks or other significant bug bites or rashes.     Past Medical History:  Diagnosis Date  . Bipolar 1 disorder [facility])      Patient Active Problem List   Diagnosis Date Noted  . AKI (acute kidney injury) (HCC) 05/17/2016     Past Surgical History:  Procedure Laterality Date  . INCISION AND DRAINAGE PERIRECTAL ABSCESS  05/10/2013  . KELOID EXCISION     Ear     Prior to Admission medications   Medication Sig Start Date End Date Taking? Authorizing Provider  clindamycin (CLEOCIN) 300 MG capsule Take 1 capsule (300 mg total) by mouth 3 (three) times daily. 05/18/16   Milagros Loll, MD  doxycycline (VIBRAMYCIN) 100 MG capsule Take 1 capsule (100 mg total) by mouth 2 (two) times daily. 04/04/17   Sharman Cheek, MD     Allergies Bactrim [sulfamethoxazole-trimethoprim]   Family History  Problem Relation Age of Onset  . Hypertension Father   . Hypertension Maternal Grandmother   . Hypertension Paternal Grandmother   . Diabetes Paternal Grandmother   . Thyroid disease Paternal Grandmother     Social History Social History  Substance Use Topics  . Smoking status: Current  Every Day Smoker  . Smokeless tobacco: Never Used  . Alcohol use Yes    Review of Systems  Constitutional:   Negative for fever, positive chills.  ENT:   Positive sore throat. No rhinorrhea. Cardiovascular:   No chest pain or syncope. Respiratory:   No dyspnea or cough. Gastrointestinal:   Negative for abdominal pain, vomiting and diarrhea.  Musculoskeletal:   Negative for focal pain or swelling. Positive generalized body aches All other systems reviewed and are negative except as documented above in ROS and HPI.  ____________________________________________   PHYSICAL EXAM:  VITAL SIGNS: ED Triage Vitals  Enc Vitals Group     BP 04/04/17 1655 (!) 141/97     Pulse Rate 04/04/17 1655 (!) 105     Resp 04/04/17 1655 16     Temp 04/04/17 1655 (!) 100.5 F (38.1 C)     Temp Source 04/04/17 1655 Oral     SpO2 04/04/17 1655 99 %     Weight 04/04/17 1656 175 lb (79.4 kg)     Height 04/04/17 1656 5\' 5"  (1.651 m)     Head Circumference --      Peak Flow --      Pain Score 04/04/17 1700 8     Pain Loc --      Pain Edu? --      Excl. in GC? --     Vital signs reviewed, nursing assessments reviewed.   Constitutional:   Alert and oriented. Well appearing and in no distress. Eyes:  No scleral icterus.  EOMI. No nystagmus. No conjunctival pallor. PERRL. ENT   Head:   Normocephalic and atraumatic.   Nose:   No congestion/rhinnorhea.    Mouth/Throat:   MMM, no pharyngeal erythema. No peritonsillar mass.    Neck:   No meningismus. Full ROM. Negative jolt accentuation. No spinal pain with extremes of range of motion with tender chest or looking up behind her. Hematological/Lymphatic/Immunilogical:   Positive anterior cervical lymphadenopathy. Cardiovascular:   RRR. Symmetric bilateral radial and DP pulses.  No murmurs.  Respiratory:   Normal respiratory effort without tachypnea/retractions. Breath sounds are clear and equal bilaterally. No  wheezes/rales/rhonchi. Gastrointestinal:   Soft and nontender. Non distended. There is no CVA tenderness.  No rebound, rigidity, or guarding. Genitourinary:   deferred Musculoskeletal:   Normal range of motion in all extremities. No joint effusions.  No lower extremity tenderness.  No edema. Neurologic:   Normal speech and language.  Motor grossly intact. No gross focal neurologic deficits are appreciated.  Skin:    Skin is warm, dry and intact. No rash noted.  No ticks found on skin exam including on the scalp. No petechiae, purpura, or bullae.  ____________________________________________    LABS (pertinent positives/negatives) (all labs ordered are listed, but only abnormal results are displayed) Labs Reviewed  CBC WITH DIFFERENTIAL/PLATELET - Abnormal; Notable for the following:       Result Value   WBC 11.6 (*)    Neutro Abs 9.3 (*)    All other components within normal limits  BASIC METABOLIC PANEL - Abnormal; Notable for the following:    Glucose, Bld 105 (*)    All other components within normal limits  URINALYSIS, COMPLETE (UACMP) WITH MICROSCOPIC - Abnormal; Notable for the following:    Color, Urine STRAW (*)    APPearance CLEAR (*)    Specific Gravity, Urine 1.002 (*)    Squamous Epithelial / LPF 0-5 (*)    All other components within normal limits  POC URINE PREG, ED  POCT PREGNANCY, URINE   ____________________________________________   EKG    ____________________________________________    RADIOLOGY  No results found.  ____________________________________________   PROCEDURES Procedures  ____________________________________________   INITIAL IMPRESSION / ASSESSMENT AND PLAN / ED COURSE  Pertinent labs & imaging results that were available during my care of the patient were reviewed by me and considered in my medical decision making (see chart for details).  Patient presents with generalized body aches with low-grade fever and tachycardia.  Tachycardia resolved with fever control. Patient is well-appearing not in distress. Not clear pattern rash to suggest [facility] spotted fever or other acute tick borne illness, but does have risk factor of recent outdoor activity about a week ago which is an appropriate time course for development of a tick related illness. No evidence of urinary tract infection pneumonia meningitis encephalitis or strep throat. No soft tissue infection RPA PTA or specific infectious source identified. Due to significant risk of a tickborne illness and the sometimes diagnostic difficulty with definitively determining this, we'll go ahead and treat her with a course of doxycycline. Patient is agreeable to this patient understands that if she is not improving in 3-4 days she is to return to primary care or the emergency department for further evaluation. If her symptoms worsen she'll return immediately. For now she'll take NSAIDs for pain and fever control.      ____________________________________________   FINAL CLINICAL IMPRESSION(S) / ED DIAGNOSES  Final diagnoses:  Body aches  Fever, unspecified fever cause      New Prescriptions   DOXYCYCLINE (VIBRAMYCIN) 100 MG CAPSULE    Take 1 capsule (100 mg total) by mouth 2 (two) times daily.     Portions of this note were generated with dragon dictation software. Dictation errors may occur despite best attempts at proofreading.    Sharman CheekStafford, Acheron Sugg, MD 04/04/17 2212

## 2017-10-01 ENCOUNTER — Emergency Department
Admission: EM | Admit: 2017-10-01 | Discharge: 2017-10-01 | Disposition: A | Discharge status: Home / Self Care | Payer: Self-pay | Attending: Student in an Organized Health Care Education/Training Program | Admitting: Student in an Organized Health Care Education/Training Program

## 2017-10-01 ENCOUNTER — Other Ambulatory Visit: Payer: Self-pay

## 2017-10-01 ENCOUNTER — Encounter: Payer: Self-pay | Admitting: Emergency Medicine

## 2017-10-01 DIAGNOSIS — Y939 Activity, unspecified: Secondary | ICD-10-CM | POA: Insufficient documentation

## 2017-10-01 DIAGNOSIS — F17200 Nicotine dependence, unspecified, uncomplicated: Secondary | ICD-10-CM | POA: Insufficient documentation

## 2017-10-01 DIAGNOSIS — X58XXXA Exposure to other specified factors, initial encounter: Secondary | ICD-10-CM | POA: Insufficient documentation

## 2017-10-01 DIAGNOSIS — M545 Low back pain, unspecified: Secondary | ICD-10-CM

## 2017-10-01 DIAGNOSIS — Y999 Unspecified external cause status: Secondary | ICD-10-CM | POA: Insufficient documentation

## 2017-10-01 DIAGNOSIS — S39012A Strain of muscle, fascia and tendon of lower back, initial encounter: Secondary | ICD-10-CM | POA: Insufficient documentation

## 2017-10-01 DIAGNOSIS — Y929 Unspecified place or not applicable: Secondary | ICD-10-CM | POA: Insufficient documentation

## 2017-10-01 LAB — URINALYSIS, ROUTINE W REFLEX MICROSCOPIC
BILIRUBIN URINE: NEGATIVE
Glucose, UA: NEGATIVE mg/dL
HGB URINE DIPSTICK: NEGATIVE
Ketones, ur: 20 mg/dL — AB
LEUKOCYTES UA: NEGATIVE
NITRITE: NEGATIVE
PROTEIN: 100 mg/dL — AB
Specific Gravity, Urine: 1.027 (ref 1.005–1.030)
pH: 6 (ref 5.0–8.0)

## 2017-10-01 MED ORDER — NAPROXEN 500 MG PO TABS
500.0000 mg | ORAL_TABLET | Freq: Once | ORAL | Status: AC
  Administered 2017-10-01: 500 mg via ORAL
  Filled 2017-10-01: qty 1

## 2017-10-01 MED ORDER — CYCLOBENZAPRINE HCL 10 MG PO TABS
10.0000 mg | ORAL_TABLET | Freq: Once | ORAL | Status: AC
  Administered 2017-10-01: 10 mg via ORAL
  Filled 2017-10-01: qty 1

## 2017-10-01 MED ORDER — NAPROXEN 500 MG PO TABS: 500.0000 mg | ORAL_TABLET | Freq: Two times a day (BID) | ORAL | 0 refills | Status: AC

## 2017-10-01 MED ORDER — CYCLOBENZAPRINE HCL 5 MG PO TABS: 5.0000 mg | ORAL_TABLET | Freq: Three times a day (TID) | ORAL | 0 refills | Status: DC | PRN

## 2017-10-01 NOTE — Discharge Instructions (Signed)
Your exam is consistent with a lumbar strain and muscle pain. Take the prescription meds as directed. Apply ice or moist heat to the back to reduce pain. Follow-up with your provider or [facility] for continued symptoms. Return to the ED for worsening symptoms.

## 2017-10-01 NOTE — ED Notes (Signed)
Ride here to pick up pt.

## 2017-10-01 NOTE — ED Provider Notes (Signed)
[facility] Emergency Department Provider Note ____________________________________________  Time seen: 1708   I have reviewed the triage vital signs and the nursing notes.  HISTORY  Chief Complaint  Back Pain   HPI Desiree Young is a 27 y.o. female presents herself to the ED for evaluation of low back pain for the past several days.  Patient denies any known injury, accident, or trauma.  She has felt apparently slightly unwell, and thought she was coming down with something.  She took Alka-Seltzer, but denies any particular symptom relief.  She also taken ibuprofen at 400 mg per dose, but denies any benefit.  She denies any urinary frequency, urgency, or hematuria.  She localizes pain to the bilateral low back crossing the midline.  She denies any referral, incontinence, foot drop, or leg weakness.  Past Medical History:  Diagnosis Date  . Bipolar 1 disorder [facility](HCC)     Patient Active Problem List   Diagnosis Date Noted  . AKI (acute kidney injury) (HCC) 05/17/2016    Past Surgical History:  Procedure Laterality Date  . INCISION AND DRAINAGE PERIRECTAL ABSCESS  05/10/2013  . KELOID EXCISION     Ear    Prior to Admission medications   Medication Sig Start Date End Date Taking? Authorizing Provider  clindamycin (CLEOCIN) 300 MG capsule Take 1 capsule (300 mg total) by mouth 3 (three) times daily. 05/18/16   Milagros LollSudini, Srikar, MD  cyclobenzaprine (FLEXERIL) 5 MG tablet Take 1 tablet (5 mg total) by mouth 3 (three) times daily as needed for muscle spasms. 10/01/17   Mathilde Mcwherter, Charlesetta IvoryJenise V Bacon, PA-C  doxycycline (VIBRAMYCIN) 100 MG capsule Take 1 capsule (100 mg total) by mouth 2 (two) times daily. 04/04/17   Sharman CheekStafford, Phillip, MD  naproxen (NAPROSYN) 500 MG tablet Take 1 tablet (500 mg total) by mouth 2 (two) times daily with a meal for 15 days. 10/01/17 10/16/17  Maddisen Vought, Charlesetta IvoryJenise V Bacon, PA-C    Allergies Bactrim [sulfamethoxazole-trimethoprim]  Family History   Problem Relation Age of Onset  . Hypertension Father   . Hypertension Maternal Grandmother   . Hypertension Paternal Grandmother   . Diabetes Paternal Grandmother   . Thyroid disease Paternal Grandmother     Social History Social History   Tobacco Use  . Smoking status: Current Every Day Smoker  . Smokeless tobacco: Never Used  Substance Use Topics  . Alcohol use: Yes  . Drug use: Not on file    Review of Systems  Constitutional: Negative for fever. Cardiovascular: Negative for chest pain. Respiratory: Negative for shortness of breath. Gastrointestinal: Negative for abdominal pain, vomiting and diarrhea. Genitourinary: Negative for dysuria. Musculoskeletal: Positive for back pain. Skin: Negative for rash. Neurological: Negative for headaches, focal weakness or numbness. ____________________________________________  PHYSICAL EXAM:  VITAL SIGNS: ED Triage Vitals  Enc Vitals Group     BP 10/01/17 1505 119/89     Pulse Rate 10/01/17 1505 99     Resp 10/01/17 1505 20     Temp 10/01/17 1505 99.3 F (37.4 C)     Temp Source 10/01/17 1505 Oral     SpO2 10/01/17 1505 100 %     Weight --      Height --      Head Circumference --      Peak Flow --      Pain Score 10/01/17 1518 8     Pain Loc --      Pain Edu? --      Excl. in GC? --  Constitutional: Alert and oriented. Well appearing and in no distress. Head: Normocephalic and atraumatic. Cardiovascular: Normal rate, regular rhythm. Normal distal pulses. Respiratory: Normal respiratory effort. No wheezes/rales/rhonchi. Gastrointestinal: Soft and nontender. No distention.  No CVA tenderness Musculoskeletal: Normal spinal alignment without midline tenderness, spasm, deformity, or step-off.  The patient tender palpation over the bilateral lumbar sacral junction and SI joints.  She transitions from sit to stand without assistance.  She is able to demonstrate normal toe and heel raise on exam.  Negative seated straight  leg raise bilaterally.  Normal lumbar flexion and self-limited extension range noted.   Nontender with normal range of motion in all extremities.  Neurologic:  Normal gait without ataxia. Normal speech and language. No gross focal neurologic deficits are appreciated. Skin:  Skin is warm, dry and intact. No rash noted. Psychiatric: Mood and affect are normal. Patient exhibits appropriate insight and judgment. ____________________________________________   LABS (pertinent positives/negatives)  Labs Reviewed  URINALYSIS, ROUTINE W REFLEX MICROSCOPIC - Abnormal; Notable for the following components:      Result Value   Color, Urine AMBER (*)    APPearance CLOUDY (*)    Ketones, ur 20 (*)    Protein, ur 100 (*)    Bacteria, UA RARE (*)    Squamous Epithelial / LPF 6-30 (*)    All other components within normal limits  ____________________________________________  PROCEDURES  Procedures Naproxen 500 mg PO Flexeril 10 mg PO ____________________________________________  INITIAL IMPRESSION / ASSESSMENT AND PLAN / ED COURSE  Patient with ED evaluation of injuries of unknown etiology.  She reports pain to the low back in the midline without referral.  She denies any known injury, accident, trauma.  Her only noted activities or pulling down the Christmas tree earlier in the week.  She was unaware of any fevers but presents today with a mild elevated temp.  She denies any nausea, vomiting, cough, congestion, or chest pain.  Her exam is overall benign at this time sorry no acute neuromuscular deficit.  No indication for x-ray imaging based on her presentation.  We will treat her with anti-inflammatories and antispasm medication.  She will follow-up with her primary care provider or [facility] for ongoing symptom management.  Return precautions have been reviewed. ____________________________________________  FINAL CLINICAL IMPRESSION(S) / ED DIAGNOSES  Final diagnoses:  Strain of lumbar region,  initial encounter  Acute midline low back pain without sciatica      Karmen Stabs, Charlesetta Ivory, PA-C 10/01/17 1802    Willy Eddy, MD 10/01/17 1907

## 2017-10-01 NOTE — ED Triage Notes (Signed)
Pt reports that she has lower back pain for the last several days. She thought that she was coming down with something took Elkaseltzer it got better than came right back. Denies any urine symptoms, or injury.

## 2020-05-31 ENCOUNTER — Other Ambulatory Visit: Payer: Self-pay

## 2020-05-31 ENCOUNTER — Encounter: Payer: Self-pay | Admitting: Emergency Medicine

## 2020-05-31 ENCOUNTER — Emergency Department
Admission: EM | Admit: 2020-05-31 | Discharge: 2020-05-31 | Disposition: A | Discharge status: Home / Self Care | Payer: BC Managed Care – PPO | Attending: Emergency Medicine | Admitting: Emergency Medicine

## 2020-05-31 DIAGNOSIS — R519 Headache, unspecified: Secondary | ICD-10-CM | POA: Diagnosis not present

## 2020-05-31 DIAGNOSIS — J069 Acute upper respiratory infection, unspecified: Secondary | ICD-10-CM | POA: Insufficient documentation

## 2020-05-31 DIAGNOSIS — R05 Cough: Secondary | ICD-10-CM | POA: Diagnosis present

## 2020-05-31 DIAGNOSIS — Z20822 Contact with and (suspected) exposure to covid-19: Secondary | ICD-10-CM | POA: Insufficient documentation

## 2020-05-31 DIAGNOSIS — F17200 Nicotine dependence, unspecified, uncomplicated: Secondary | ICD-10-CM | POA: Diagnosis not present

## 2020-05-31 LAB — SARS CORONAVIRUS 2 BY RT PCR (HOSPITAL ORDER, PERFORMED IN ~~LOC~~ HOSPITAL LAB): SARS Coronavirus 2: NEGATIVE

## 2020-05-31 MED ORDER — AZITHROMYCIN 250 MG PO TABS: ORAL_TABLET | ORAL | 0 refills | Status: DC

## 2020-05-31 MED ORDER — ACETAMINOPHEN 325 MG PO TABS
650.0000 mg | ORAL_TABLET | Freq: Once | ORAL | Status: AC | PRN
  Administered 2020-05-31: 650 mg via ORAL
  Filled 2020-05-31: qty 2

## 2020-05-31 NOTE — Discharge Instructions (Signed)
Covid test was negative today. You may return to work after you have not had a fever for 24 to 48 hours. Drink plenty of fluids.  Take medication as prescribed If you continue to get worse, you may want to be retested for Covid.

## 2020-05-31 NOTE — ED Provider Notes (Signed)
[facility] Emergency Department Provider Note  ____________________________________________   First MD Initiated Contact with Patient 05/31/20 1142     (approximate)  I have reviewed the triage vital signs and the nursing notes.   HISTORY  Chief Complaint Generalized Body Aches    HPI Desiree ANASTAS is a 29 y.o. female presents emergency department Covid-like symptoms.  Patient states she started having fever, chills, body aches, headache last night.  Mild cough and sore throat.  No vomiting or diarrhea.  Patient is unvaccinated for Covid    Past Medical History:  Diagnosis Date  . Bipolar 1 disorder [facility])     Patient Active Problem List   Diagnosis Date Noted  . AKI (acute kidney injury) (HCC) 05/17/2016    Past Surgical History:  Procedure Laterality Date  . INCISION AND DRAINAGE PERIRECTAL ABSCESS  05/10/2013  . KELOID EXCISION     Ear    Prior to Admission medications   Medication Sig Start Date End Date Taking? Authorizing Provider  azithromycin (ZITHROMAX Z-PAK) 250 MG tablet 2 pills today then 1 pill a day for 4 days 05/31/20   Sherrie Mustache Roselyn Bering, PA-C    Allergies Bactrim [sulfamethoxazole-trimethoprim]  Family History  Problem Relation Age of Onset  . Hypertension Father   . Hypertension Maternal Grandmother   . Hypertension Paternal Grandmother   . Diabetes Paternal Grandmother   . Thyroid disease Paternal Grandmother     Social History Social History   Tobacco Use  . Smoking status: Current Every Day Smoker  . Smokeless tobacco: Never Used  Substance Use Topics  . Alcohol use: Yes  . Drug use: Not on file    Review of Systems  Constitutional: Positive fever/chills Eyes: No visual changes. ENT: Positive sore throat. Respiratory: Positive cough Cardiovascular: Denies chest pain Gastrointestinal: Denies abdominal pain Genitourinary: Negative for dysuria. Musculoskeletal: Negative for back pain. Skin: Negative for  rash. Psychiatric: no mood changes,     ____________________________________________   PHYSICAL EXAM:  VITAL SIGNS: ED Triage Vitals  Enc Vitals Group     BP 05/31/20 1136 128/87     Pulse Rate 05/31/20 1136 100     Resp 05/31/20 1136 16     Temp 05/31/20 1136 (!) 101.2 F (38.4 C)     Temp Source 05/31/20 1136 Oral     SpO2 05/31/20 1136 97 %     Weight 05/31/20 1132 175 lb (79.4 kg)     Height 05/31/20 1132 5\' 5"  (1.651 m)     Head Circumference --      Peak Flow --      Pain Score 05/31/20 1131 10     Pain Loc --      Pain Edu? --      Excl. in GC? --     Constitutional: Alert and oriented. Well appearing and in no acute distress. Eyes: Conjunctivae are normal.  Head: Atraumatic. Nose: No congestion/rhinnorhea. Mouth/Throat: Mucous membranes are moist.  Throat appears normal Neck:  supple no lymphadenopathy noted Cardiovascular: Normal rate, regular rhythm. Heart sounds are normal Respiratory: Normal respiratory effort.  No retractions, lungs c t a  GU: deferred Musculoskeletal: FROM all extremities, warm and well perfused Neurologic:  Normal speech and language.  Skin:  Skin is warm, dry and intact. No rash noted. Psychiatric: Mood and affect are normal. Speech and behavior are normal.  ____________________________________________   LABS (all labs ordered are listed, but only abnormal results are displayed)  Labs Reviewed  SARS  CORONAVIRUS 2 BY RT PCR (HOSPITAL ORDER, PERFORMED IN [facility])   ____________________________________________   ____________________________________________  RADIOLOGY    ____________________________________________   PROCEDURES  Procedure(s) performed: No  Procedures    ____________________________________________   INITIAL IMPRESSION / ASSESSMENT AND PLAN / ED COURSE  Pertinent labs & imaging results that were available during my care of the patient were reviewed by me and considered in my  medical decision making (see chart for details).   Patient is a 29 year old female presents emergency department Covid-like symptoms.  Physical exam is reassuring that patient is stable.  She is febrile.  Exam is basically unremarkable.  Covid test is negative  Explained the findings to the patient,  rx for zpack, recheck with her regular doctor if not better in 3 days, return if worsening.  Repeat covid test if worsening     Desiree Young was evaluated in Emergency Department on 05/31/2020 for the symptoms described in the history of present illness. She was evaluated in the context of the global COVID-19 pandemic, which necessitated consideration that the patient might be at risk for infection with the SARS-CoV-2 virus that causes COVID-19. Institutional protocols and algorithms that pertain to the evaluation of patients at risk for COVID-19 are in a state of rapid change based on information released by regulatory bodies including the CDC and federal and state organizations. These policies and algorithms were followed during the patient's care in the ED.    As part of my medical decision making, I reviewed the following data within the electronic MEDICAL RECORD NUMBER Nursing notes reviewed and incorporated, Labs reviewed , Old chart reviewed, Notes from prior ED visits and [place] Controlled Substance Database  ____________________________________________   FINAL CLINICAL IMPRESSION(S) / ED DIAGNOSES  Final diagnoses:  Acute URI      NEW MEDICATIONS STARTED DURING THIS VISIT:  New Prescriptions   AZITHROMYCIN (ZITHROMAX Z-PAK) 250 MG TABLET    2 pills today then 1 pill a day for 4 days     Note:  This document was prepared using Dragon voice recognition software and may include unintentional dictation errors.    Faythe Ghee, PA-C 05/31/20 1308    Gilles Chiquito, MD 05/31/20 1515

## 2020-05-31 NOTE — ED Triage Notes (Signed)
Here for body aches, chills, headache and sore throat since last night. No known covid exposure.  Unlabored. Ambulatory.

## 2021-10-01 ENCOUNTER — Other Ambulatory Visit: Payer: Self-pay

## 2021-10-01 ENCOUNTER — Observation Stay
Admission: EM | Admit: 2021-10-01 | Discharge: 2021-10-02 | Disposition: A | Discharge status: Home / Self Care | Payer: BC Managed Care – PPO | Attending: Internal Medicine | Admitting: Internal Medicine

## 2021-10-01 ENCOUNTER — Emergency Department: Payer: BC Managed Care – PPO

## 2021-10-01 DIAGNOSIS — K047 Periapical abscess without sinus: Principal | ICD-10-CM | POA: Diagnosis present

## 2021-10-01 DIAGNOSIS — F17200 Nicotine dependence, unspecified, uncomplicated: Secondary | ICD-10-CM | POA: Insufficient documentation

## 2021-10-01 DIAGNOSIS — R220 Localized swelling, mass and lump, head: Secondary | ICD-10-CM | POA: Diagnosis present

## 2021-10-01 DIAGNOSIS — D72829 Elevated white blood cell count, unspecified: Secondary | ICD-10-CM | POA: Diagnosis not present

## 2021-10-01 DIAGNOSIS — L03211 Cellulitis of face: Secondary | ICD-10-CM | POA: Diagnosis not present

## 2021-10-01 DIAGNOSIS — Z720 Tobacco use: Secondary | ICD-10-CM | POA: Diagnosis not present

## 2021-10-01 DIAGNOSIS — Z20822 Contact with and (suspected) exposure to covid-19: Secondary | ICD-10-CM | POA: Insufficient documentation

## 2021-10-01 DIAGNOSIS — R030 Elevated blood-pressure reading, without diagnosis of hypertension: Secondary | ICD-10-CM

## 2021-10-01 HISTORY — DX: Periapical abscess without sinus: K04.7

## 2021-10-01 LAB — BASIC METABOLIC PANEL
Anion gap: 9 (ref 5–15)
BUN: 10 mg/dL (ref 6–20)
CO2: 22 mmol/L (ref 22–32)
Calcium: 9 mg/dL (ref 8.9–10.3)
Chloride: 104 mmol/L (ref 98–111)
Creatinine, Ser: 0.56 mg/dL (ref 0.44–1.00)
GFR, Estimated: >60 mL/min (ref >60)
Glucose, Bld: 93 mg/dL (ref 70–99)
Potassium: 4.1 mmol/L (ref 3.5–5.1)
Sodium: 135 mmol/L (ref 135–145)

## 2021-10-01 LAB — CBC
HCT: 39.4 % (ref 36.0–46.0)
Hemoglobin: 13.4 g/dL (ref 12.0–15.0)
MCH: 29.5 pg (ref 26.0–34.0)
MCHC: 34 g/dL (ref 30.0–36.0)
MCV: 86.8 fL (ref 80.0–100.0)
Platelets: 290 10*3/uL (ref 150–400)
RBC: 4.54 MIL/uL (ref 3.87–5.11)
RDW: 12.8 % (ref 11.5–15.5)
WBC: 14.8 10*3/uL — ABNORMAL HIGH (ref 4.0–10.5)
nRBC: 0 % (ref 0.0–0.2)

## 2021-10-01 LAB — HIV ANTIBODY (ROUTINE TESTING W REFLEX): HIV Screen 4th Generation wRfx: NONREACTIVE

## 2021-10-01 LAB — LACTIC ACID, PLASMA
Lactic Acid, Venous: 0.9 mmol/L (ref 0.5–1.9)
Lactic Acid, Venous: 1 mmol/L (ref 0.5–1.9)

## 2021-10-01 LAB — HCG, QUANTITATIVE, PREGNANCY: hCG, Beta Chain, Quant, S: <1 m[IU]/mL (ref <5)

## 2021-10-01 MED ORDER — DEXAMETHASONE SODIUM PHOSPHATE 10 MG/ML IJ SOLN
10.0000 mg | Freq: Three times a day (TID) | INTRAMUSCULAR | Status: AC
  Administered 2021-10-01 – 2021-10-02 (×2): 10 mg via INTRAVENOUS
  Filled 2021-10-01 (×2): qty 1

## 2021-10-01 MED ORDER — OXYCODONE-ACETAMINOPHEN 5-325 MG PO TABS
1.0000 | ORAL_TABLET | ORAL | Status: AC | PRN
  Administered 2021-10-01 (×2): 1 via ORAL
  Filled 2021-10-01 (×2): qty 1

## 2021-10-01 MED ORDER — IOHEXOL 300 MG/ML  SOLN
75.0000 mL | Freq: Once | INTRAMUSCULAR | Status: AC | PRN
  Administered 2021-10-01: 75 mL via INTRAVENOUS
  Filled 2021-10-01: qty 75

## 2021-10-01 MED ORDER — DEXAMETHASONE SODIUM PHOSPHATE 10 MG/ML IJ SOLN
10.0000 mg | Freq: Once | INTRAMUSCULAR | Status: AC
  Administered 2021-10-01: 10 mg via INTRAVENOUS
  Filled 2021-10-01: qty 1

## 2021-10-01 MED ORDER — SODIUM CHLORIDE 0.9 % IV SOLN
3.0000 g | Freq: Four times a day (QID) | INTRAVENOUS | Status: DC
  Administered 2021-10-01 – 2021-10-02 (×3): 3 g via INTRAVENOUS
  Filled 2021-10-01 (×5): qty 8
  Filled 2021-10-01: qty 3
  Filled 2021-10-01 (×2): qty 8

## 2021-10-01 MED ORDER — KETOROLAC TROMETHAMINE 30 MG/ML IJ SOLN
15.0000 mg | Freq: Once | INTRAMUSCULAR | Status: AC
  Administered 2021-10-01: 15 mg via INTRAVENOUS
  Filled 2021-10-01: qty 1

## 2021-10-01 MED ORDER — SODIUM CHLORIDE 0.9 % IV SOLN
3.0000 g | Freq: Once | INTRAVENOUS | Status: AC
  Administered 2021-10-01: 3 g via INTRAVENOUS
  Filled 2021-10-01: qty 8

## 2021-10-01 MED ORDER — OXYCODONE HCL 5 MG PO TABS: 5.0000 mg | ORAL_TABLET | ORAL | Status: DC | PRN

## 2021-10-01 NOTE — ED Triage Notes (Signed)
Pt to ED for dental abscess left lower for over a week. Reports went to UC on Saturday, given antibiotics and steroid with no relief. Significant swelling noted to left side of face. No swelling noted to tongue.  RR even and unlabored.

## 2021-10-01 NOTE — H&P (Signed)
History and Physical  Desiree Young [id number] DOB: 10-02-1990 DOA: 10/01/2021  Referring physician: Rada Hay, MD  PCP: Lenard Simmer, MD  Patient coming from: Home  Chief Complaint: Left-sided jaw and tooth pain  HPI: Desiree Young is a 31 y.o. female with significant medical history of tobacco abuse who presents to the emergency department due to 5 day onset of left-sided facial swelling and pain.  Patient complained of difficulty in being able to open her mouth and to swallow due to increased pain, she also complained of swelling sensation under her tongue.  She went to an urgent care and she was prescribed with amoxicillin twice daily as well as prednisone for 4 days, however, patient complained of worsening and progressive swelling despite being compliant with the medication regimen.  Jaw and facial pain on left side was aggravated with mouth opening and this has affected her ability to be able to eat as she should.  She denies fever, chills, chest pain, shortness of breath, nausea, vomiting or abdominal pain.  ED Course:  In the emergency department, she was hemodynamically stable.  Work-up in the ED showed normal CBC except for leukocytosis and normal BMP, lactic acid was 0.9. CT maxillofacial with contrast showed odontogenic infection with 2 cm Subperiosteal Abscess located at the junction of the posterior left sublingual and anterior left submandibular spaces, adjacent to carious left mandible posterior molar. Regional mass effect, and soft tissue edema in the left submandibular, masticator and parapharyngeal spaces - in addition to superficial cellulitis. Reactive left level 1 and level 2 lymph nodes. ENT was consulted and recommended IV antibiotics and Decadron.  Patient was started on IV Unasyn and Decadron x1 was given.  Hospitalist was asked to admit patient for further evaluation and management.  Review of Systems: Constitutional: Negative for chills and fever.   HENT: Positive for left-sided tooth and jaw pain.  Negative for ear pain  Eyes: Negative for pain and visual disturbance.  Respiratory: Negative for cough, chest tightness and shortness of breath.   Cardiovascular: Negative for chest pain and palpitations.  Gastrointestinal: Negative for abdominal pain and vomiting.  Endocrine: Negative for polyphagia and polyuria.  Genitourinary: Negative for decreased urine volume, dysuria, enuresis Musculoskeletal: Negative for arthralgias and back pain.  Skin: Negative for color change and rash.  Allergic/Immunologic: Negative for immunocompromised state.  Neurological: Negative for tremors, syncope, speech difficulty  Hematological: Does not bruise/bleed easily.  All other systems reviewed and are negative   Past Medical History:  Diagnosis Date   Bipolar 1 disorder [facility])    Past Surgical History:  Procedure Laterality Date   INCISION AND DRAINAGE PERIRECTAL ABSCESS  05/10/2013   KELOID EXCISION     Ear    Social History:  reports that she has been smoking. She has never used smokeless tobacco. She reports current alcohol use. No history on file for drug use.   Allergies  Allergen Reactions   Bactrim [Sulfamethoxazole-Trimethoprim] Nausea And Vomiting    Family History  Problem Relation Age of Onset   Hypertension Father    Hypertension Maternal Grandmother    Hypertension Paternal Grandmother    Diabetes Paternal Grandmother    Thyroid disease Paternal Grandmother      Prior to Admission medications   Not on File    Physical Exam: BP (!) 130/99 (BP Location: Right Arm)    Pulse 90    Temp 99.8 F (37.7 C) (Oral)    Resp 18    Ht 5'  5" (1.651 m)    Wt 74.8 kg    LMP 09/05/2021    SpO2 98%    BMI 27.46 kg/m   General: 32 y.o. year-old female well developed well nourished in no acute distress.  Alert and oriented x3. HEENT: Left-sided jaw swelling, tender to touch, pain on opening mouth.  NCAT, EOMI Neck: Supple, trachea  medial Cardiovascular: Regular rate and rhythm with no rubs or gallops.  No thyromegaly or JVD noted.  No lower extremity edema. 2/4 pulses in all 4 extremities. Respiratory: Clear to auscultation with no wheezes or rales. Good inspiratory effort. Abdomen: Soft, nontender nondistended with normal bowel sounds x4 quadrants. Muskuloskeletal: No cyanosis, clubbing or edema noted bilaterally Neuro: CN II-XII intact, strength 5/5 x 4, sensation, reflexes intact Skin: No ulcerative lesions noted or rashes Psychiatry: Judgement and insight appear normal. Mood is appropriate for condition and setting          Labs on Admission:  Basic Metabolic Panel: Recent Labs  Lab 10/01/21 0715  NA 135  K 4.1  CL 104  CO2 22  GLUCOSE 93  BUN 10  CREATININE 0.56  CALCIUM 9.0   Liver Function Tests: No results for input(s): AST, ALT, ALKPHOS, BILITOT, PROT, ALBUMIN in the last 168 hours. No results for input(s): LIPASE, AMYLASE in the last 168 hours. No results for input(s): AMMONIA in the last 168 hours. CBC: Recent Labs  Lab 10/01/21 0715  WBC 14.8*  HGB 13.4  HCT 39.4  MCV 86.8  PLT 290   Cardiac Enzymes: No results for input(s): CKTOTAL, CKMB, CKMBINDEX, TROPONINI in the last 168 hours.  BNP (last 3 results) No results for input(s): BNP in the last 8760 hours.  ProBNP (last 3 results) No results for input(s): PROBNP in the last 8760 hours.  CBG: No results for input(s): GLUCAP in the last 168 hours.  Radiological Exams on Admission: CT Maxillofacial W Contrast  Result Date: 10/01/2021 CLINICAL DATA:  31 year old female with left lower dental pain for over a week. No relief with antibiotics and steroids. Left facial swelling. EXAM: CT MAXILLOFACIAL WITH CONTRAST TECHNIQUE: Multidetector CT imaging of the maxillofacial structures was performed with intravenous contrast. Multiplanar CT image reconstructions were also generated. CONTRAST:  46mL OMNIPAQUE IOHEXOL 300 MG/ML  SOLN  COMPARISON:  None. FINDINGS: Osseous: Mandible normally located. Carious right maxillary anterior molar. Carious left maxillary wisdom tooth. Carious left mandible posterior molar with associated periapical lucency, but no obvious mandible dehiscence. Other facial bones appear intact. Intact central skull base and visible cervical vertebrae. Orbits: Intact orbital walls and visible calvarium. Orbits soft tissues appears symmetric and normal. Sinuses: Clear throughout. Soft tissues: Abnormal inflammation and soft tissue swelling in the left lower masticator space with a slightly serpiginous abnormal hypodense mass along the medial posterior body of the mandible (series 2, image 56 and series 4, image 39) encompassing 14 x 13 x 20 mm compatible with subperiosteal abscess. This is in proximity to the carious left mandible molar, and there is a trace amount of gas in the superior aspect of the collection near the dentition (series 6, image 36). Subsequent mass effect on the left submandibular space and mild mass effect on the central sublingual space. No bona fide sublingual space inflammation, but there is soft tissue edema in the left submandibular space and thickening of the adjacent platysma. Reactive left level 1 B lymph nodes up to 12 mm short axis individually. Reactive left level 2 lymph nodes individually up to 11 mm short  axis. And the superficial soft tissue inflammation wraps around the submental space to the right (series 4, image 45). Superimposed extensive bilateral salivary gland nodularity and sialolithiasis-especially the parotid glands. Negative visible thyroid, larynx, pharynx, retropharyngeal space, right parapharyngeal and right masticator spaces. There is mild inflammation in the left parapharyngeal space. The major vascular structures in the neck and at the skull base remain patent. Limited intracranial: Negative. IMPRESSION: 1. Odontogenic infection with 2 cm Subperiosteal Abscess located at the  junction of the posterior left sublingual and anterior left submandibular spaces, adjacent to carious left mandible posterior molar. Regional mass effect, and soft tissue edema in the left submandibular, masticator and parapharyngeal spaces - in addition to superficial cellulitis. Reactive left level 1 and level 2 lymph nodes. 2. Extensive nodularity and sialolithiasis of the bilateral salivary glands. This appearance can be seen with Sjogren Syndrome and similar systemic inflammatory processes. Electronically Signed   By: Genevie Ann M.D.   On: 10/01/2021 11:13    EKG: I independently viewed the EKG done and my findings are as followed: EKG was not done in the ED  Assessment/Plan Present on Admission:  Dental abscess  Principal Problem:   Dental abscess Active Problems:   Leukocytosis  Odontogenic infection and abscess with superimposed superficial cellulitis s/p outpatient treatment failure Continue IV Unasyn Continue Decadron 10 mg every 8 hours per ENT recommendation Continue Percocet Continue oxycodone 5 mg every 4 hours as needed Patient will be placed n.p.o. at midnight in anticipation for possible surgical intervention in the morning ENT was already consulted by ED physician and following  2. Bilateral salivary gland nodularity and sialolithiasis possibly due to above Continue management as described above  3. Leukocytosis secondary to above 2 Continue treatment as described in #1  4.  Tobacco abuse Patient was counseled on tobacco abuse cessation   DVT prophylaxis: SCDs (consider starting patient on chemotherapy if no indication for surgical intervention s/p ENT follow-up)  Code Status: Full code  Family Communication: None at bedside  Disposition Plan:  Patient is from:                        home Anticipated DC to:                   SNF or family members home Anticipated DC date:               2-3 days Anticipated DC barriers:          Patient requires inpatient  management due to odontogenic infection and abscess requiring IV antibiotics s/p outpatient treatment failure    Consults called: ENT (by ED physician)  Admission status: Inpatient    Bernadette Hoit MD [facility]  10/01/2021, 12:56 PM

## 2021-10-01 NOTE — ED Provider Notes (Signed)
[facility] Provider Note    Event Date/Time   First MD Initiated Contact with Patient 10/01/21 1017     (approximate)   History   Abscess   HPI  Desiree Young is a 31 y.o. female with no significant past medical history presents with pain and swelling in the left side of her face.  Symptoms started about 1 week ago.  She first noticed pain of the left lower molar and swelling.  She went to urgent care last Saturday and has been on amoxicillin since that time.  She has had progressive swelling and pain.  Now having difficulty opening her mouth and feels like under her tongue is swollen.  No fevers.    Past Medical History:  Diagnosis Date   Bipolar 1 disorder [facility])     Patient Active Problem List   Diagnosis Date Noted   AKI (acute kidney injury) ([place]) 05/17/2016     Physical Exam  Triage Vital Signs: ED Triage Vitals  Enc Vitals Group     BP 10/01/21 0709 (!) 137/103     Pulse Rate 10/01/21 0709 (!) 102     Resp 10/01/21 0709 20     Temp 10/01/21 0709 99.8 F (37.7 C)     Temp Source 10/01/21 0709 Oral     SpO2 10/01/21 0709 98 %     Weight 10/01/21 0710 165 lb (74.8 kg)     Height 10/01/21 0710 5\' 5"  (1.651 m)     Head Circumference --      Peak Flow --      Pain Score 10/01/21 0710 10     Pain Loc --      Pain Edu? --      Excl. in [place]? --     Most recent vital signs: Vitals:   10/01/21 0709 10/01/21 1025  BP: (!) 137/103 (!) 130/99  Pulse: (!) 102 90  Resp: 20 18  Temp: 99.8 F (37.7 C)   SpO2: 98% 98%     General: Awake, patient is tearful, appears uncomfortable CV:  Good peripheral perfusion.  Resp:  Normal effort.  No stridor, tolerating secretions Neuro:             Awake, Alert, Oriented x 3  Other:  Significant swelling noted in the submandibular region extending under the submental area with tenderness to palpation, no erythema Difficult trismus noted on exam, no swelling under the tongue or elevation Tenderness  to percussion of the left most posterior molar, obvious fluctuance outpatient on exam   ED Results / Procedures / Treatments  Labs (all labs ordered are listed, but only abnormal results are displayed) Labs Reviewed  CBC - Abnormal; Notable for the following components:      Result Value   WBC 14.8 (*)    All other components within normal limits  BASIC METABOLIC PANEL  LACTIC ACID, PLASMA  HCG, QUANTITATIVE, PREGNANCY  LACTIC ACID, PLASMA     EKG     RADIOLOGY Reviewed the CT max face which shows a 2 cm periosteal abscess with associated soft tissue swelling and cellulitis, with radiology report   PROCEDURES:  Critical Care performed: No     MEDICATIONS ORDERED IN ED: Medications  oxyCODONE-acetaminophen (PERCOCET/ROXICET) 5-325 MG per tablet 1 tablet (1 tablet Oral Given 10/01/21 0714)  dexamethasone (DECADRON) injection 10 mg (has no administration in time range)  Ampicillin-Sulbactam (UNASYN) 3 g in sodium chloride 0.9 % 100 mL IVPB (3 g Intravenous New Bag/Given  10/01/21 1039)  ketorolac (TORADOL) 30 MG/ML injection 15 mg (15 mg Intravenous Given 10/01/21 1038)  iohexol (OMNIPAQUE) 300 MG/ML solution 75 mL (75 mLs Intravenous Contrast Given 10/01/21 1053)     IMPRESSION / MDM / ASSESSMENT AND PLAN / ED COURSE  I reviewed the triage vital signs and the nursing notes.                              Differential diagnosis includes, but is not limited to, deep space infection, osteomyelitis of the jaw, dental abscess, Ludwigs angina   The patient is a 31 year old female presenting with progressive dental pain and facial swelling for about 1 week now.  Has been on antibiotics for 6 days and has had progressing symptoms.  Vital signs are within normal limits.  Labs were done from triage and are notable for leukocytosis of 14 but normal lactate.  On exam the patient has significant submandibular and submental swelling that is extending across the midline she also is noted to  have trismus.  There is no swelling under the tongue and she is tolerating her secretions.  Given her exam I am concerned for deep space infection including Ludwig's angina.  Also concerned by the fact that she has essentially failed outpatient antibiotics.  We will plan to obtain a CT of the max face and give a dose of Unasyn.  Will likely need to be admitted.  CT max face shows a 2 cm periosteal abscess with significant tissue swelling and cellulitis causing mass-effect.  Discussed with ENT on-call who will evaluate the patient.  Will likely need IV antibiotics and possible I&D as an inpatient.  Patient seen by ENT physician who agrees with IV antibiotics and also recommends 10 mg of IV Dex.  He will see the patient again in the morning to reassess whether she needs to have an I&D.  Will admit to the hospitalist service.  FINAL CLINICAL IMPRESSION(S) / ED DIAGNOSES   Final diagnoses:  Dental abscess     Rx / DC Orders   ED Discharge Orders     None        Note:  This document was prepared using Dragon voice recognition software and may include unintentional dictation errors.   Rada Hay, MD 10/01/21 1228

## 2021-10-01 NOTE — ED Notes (Signed)
Needs IV, CT max/face    Rada Hay, MD 10/01/21 1031

## 2021-10-01 NOTE — Consult Note (Signed)
..  Desiree Young, Desiree Young [id number] 18-Apr-1991 Desiree Hacking, MD  Reason for Consult: Odontogenic abscess  HPI: 31 y.o. female presented to ER with 5 day history of left sided tooth and jaw pain.  The pain and swelling has progressed to the point she is having difficulty opening her jaw and swallowing.  Reports some swelling under her tongue today which was new.  Evaluated by outside physician and placed on Amoxicillin 875 BID and 4 days of prednisone.  Patient reports swelling progressed despite taking the medications.  Reports she has appointment with oral surgeon in about 10 days.  Denies any breathing issues.  Reports left sided jaw and facial pain and difficulty eating due to difficulty opening mouth.  Allergies:  Allergies  Allergen Reactions   Bactrim [Sulfamethoxazole-Trimethoprim] Nausea And Vomiting    ROS: Review of systems normal other than 12 systems except per HPI.  PMH:  Past Medical History:  Diagnosis Date   Bipolar 1 disorder (HCC)     FH:  Family History  Problem Relation Age of Onset   Hypertension Father    Hypertension Maternal Grandmother    Hypertension Paternal Grandmother    Diabetes Paternal Grandmother    Thyroid disease Paternal Grandmother     SH:  Social History   Socioeconomic History   Marital status: Single    Spouse name: Not on file   Number of children: Not on file   Years of education: Not on file   Highest education level: Not on file  Occupational History   Not on file  Tobacco Use   Smoking status: Every Day   Smokeless tobacco: Never  Substance and Sexual Activity   Alcohol use: Yes   Drug use: Not on file   Sexual activity: Not on file  Other Topics Concern   Not on file  Social History Narrative   Not on file   Social Determinants of Health   Financial Resource Strain: Not on file  Food Insecurity: Not on file  Transportation Needs: Not on file  Physical Activity: Not on file  Stress: Not on file  Social Connections:  Not on file  Intimate Partner Violence: Not on file    PSH:  Past Surgical History:  Procedure Laterality Date   INCISION AND DRAINAGE PERIRECTAL ABSCESS  05/10/2013   KELOID EXCISION     Ear    Physical  Exam:  GEN-  NAD, supine in bed EARS- external ears clear NOSE-  clear anteriorly OC/OP-  Trismus to 50mm, left side lateral mandibular with widening and tenderness but no fluctuance.  Medial left mandibular swelling and fluid collection of approximately 1.5cm visible adjacent to 2nd molar.  Floof of mouth soft with no induration.   NEURO- CN 2-12 grossly intact and symmetric NECK-  left submental and submandibular tenderness and induration without flucutance RESP- unlabored CARD-  RRR  CT-  Left odontogenic cellulitis with focal abscess on medical aspect of mandible, carious dentition   A/P: Odontogenic cellulitis and abscess  Plan:  Discussed findings with patient.  Most of her symptoms are due to lateral swelling and induration and not the small abscess on the medial aspect.  Recommend admission for IV antibiotics (agree with Unasyn) and steroids (Decadron 10mg  IV q8 hours x 24hours) overnight.  Soft diet.  Could consider I&D in morning if trismus is improved and fluid collection still present.  Will follow up in a.m.   10/01/2021 12:37 PM

## 2021-10-02 DIAGNOSIS — R030 Elevated blood-pressure reading, without diagnosis of hypertension: Secondary | ICD-10-CM

## 2021-10-02 DIAGNOSIS — Z720 Tobacco use: Secondary | ICD-10-CM | POA: Diagnosis not present

## 2021-10-02 DIAGNOSIS — D72829 Elevated white blood cell count, unspecified: Secondary | ICD-10-CM | POA: Diagnosis not present

## 2021-10-02 DIAGNOSIS — K047 Periapical abscess without sinus: Secondary | ICD-10-CM | POA: Diagnosis not present

## 2021-10-02 LAB — COMPREHENSIVE METABOLIC PANEL
ALT: 16 U/L (ref 0–44)
AST: 14 U/L — ABNORMAL LOW (ref 15–41)
Albumin: 3.4 g/dL — ABNORMAL LOW (ref 3.5–5.0)
Alkaline Phosphatase: 51 U/L (ref 38–126)
Anion gap: 7 (ref 5–15)
BUN: 9 mg/dL (ref 6–20)
CO2: 24 mmol/L (ref 22–32)
Calcium: 9.2 mg/dL (ref 8.9–10.3)
Chloride: 104 mmol/L (ref 98–111)
Creatinine, Ser: 0.6 mg/dL (ref 0.44–1.00)
GFR, Estimated: >60 mL/min (ref >60)
Glucose, Bld: 104 mg/dL — ABNORMAL HIGH (ref 70–99)
Potassium: 4.1 mmol/L (ref 3.5–5.1)
Sodium: 135 mmol/L (ref 135–145)
Total Bilirubin: 0.7 mg/dL (ref 0.3–1.2)
Total Protein: 8.2 g/dL — ABNORMAL HIGH (ref 6.5–8.1)

## 2021-10-02 LAB — CBC
HCT: 36.8 % (ref 36.0–46.0)
Hemoglobin: 12.9 g/dL (ref 12.0–15.0)
MCH: 30 pg (ref 26.0–34.0)
MCHC: 35.1 g/dL (ref 30.0–36.0)
MCV: 85.6 fL (ref 80.0–100.0)
Platelets: 331 10*3/uL (ref 150–400)
RBC: 4.3 MIL/uL (ref 3.87–5.11)
RDW: 13 % (ref 11.5–15.5)
WBC: 8.3 10*3/uL (ref 4.0–10.5)
nRBC: 0 % (ref 0.0–0.2)

## 2021-10-02 LAB — PHOSPHORUS: Phosphorus: 3.3 mg/dL (ref 2.5–4.6)

## 2021-10-02 LAB — RESP PANEL BY RT-PCR (FLU A&B, COVID) ARPGX2
Influenza A by PCR: NEGATIVE
Influenza B by PCR: NEGATIVE
SARS Coronavirus 2 by RT PCR: NEGATIVE

## 2021-10-02 LAB — MAGNESIUM: Magnesium: 2.4 mg/dL (ref 1.7–2.4)

## 2021-10-02 MED ORDER — OXYCODONE HCL 5 MG PO TABS: 5.0000 mg | ORAL_TABLET | Freq: Four times a day (QID) | ORAL | 0 refills | Status: AC | PRN

## 2021-10-02 MED ORDER — CLINDAMYCIN HCL 300 MG PO CAPS: 300.0000 mg | ORAL_CAPSULE | Freq: Three times a day (TID) | ORAL | 0 refills | Status: AC

## 2021-10-02 MED ORDER — PREDNISONE 10 MG PO TABS: ORAL_TABLET | ORAL | 0 refills | Status: DC

## 2021-10-02 MED ORDER — ACETAMINOPHEN 325 MG PO TABS: 650.0000 mg | ORAL_TABLET | Freq: Four times a day (QID) | ORAL | Status: DC | PRN

## 2021-10-02 NOTE — Progress Notes (Signed)
..10/02/2021 12:04 PM  [name] [id number]  Hospital Day 2    Temp:  [98 F (36.7 C)-99.6 F (37.6 C)] 98.1 F (36.7 C) (01/06 1156) Pulse Rate:  [32-89] 69 (01/06 1156) Resp:  [16-18] 16 (01/06 1156) BP: (132-155)/(70-99) 145/99 (01/06 1156) SpO2:  [88 %-100 %] 100 % (01/06 1156),     Intake/Output Summary (Last 24 hours) at 10/02/2021 1204 Last data filed at 10/02/2021 0900 Gross per 24 hour  Intake 340 ml  Output --  Net 340 ml    Results for orders placed or performed during the hospital encounter of 10/01/21 (from the past 24 hour(s))  Lactic acid, plasma     Status: None   Collection Time: 10/01/21  4:15 PM  Result Value Ref Range   Lactic Acid, Venous 1.0 0.5 - 1.9 mmol/L  HIV Antibody (routine testing w rflx)     Status: None   Collection Time: 10/01/21  4:15 PM  Result Value Ref Range   HIV Screen 4th Generation wRfx Non Reactive Non Reactive  Resp Panel by RT-PCR (Flu A&B, Covid) Nasopharyngeal Swab     Status: None   Collection Time: 10/02/21  1:09 AM   Specimen: Nasopharyngeal Swab; Nasopharyngeal(NP) swabs in vial transport medium  Result Value Ref Range   SARS Coronavirus 2 by RT PCR NEGATIVE NEGATIVE   Influenza A by PCR NEGATIVE NEGATIVE   Influenza B by PCR NEGATIVE NEGATIVE  Comprehensive metabolic panel     Status: Abnormal   Collection Time: 10/02/21  6:52 AM  Result Value Ref Range   Sodium 135 135 - 145 mmol/L   Potassium 4.1 3.5 - 5.1 mmol/L   Chloride 104 98 - 111 mmol/L   CO2 24 22 - 32 mmol/L   Glucose, Bld 104 (H) 70 - 99 mg/dL   BUN 9 6 - 20 mg/dL   Creatinine, Ser 6.57 0.44 - 1.00 mg/dL   Calcium 9.2 8.9 - 84.6 mg/dL   Total Protein 8.2 (H) 6.5 - 8.1 g/dL   Albumin 3.4 (L) 3.5 - 5.0 g/dL   AST 14 (L) 15 - 41 U/L   ALT 16 0 - 44 U/L   Alkaline Phosphatase 51 38 - 126 U/L   Total Bilirubin 0.7 0.3 - 1.2 mg/dL   GFR, Estimated >96 >29 mL/min   Anion gap 7 5 - 15  CBC     Status: None   Collection Time: 10/02/21  6:52 AM  Result  Value Ref Range   WBC 8.3 4.0 - 10.5 K/uL   RBC 4.30 3.87 - 5.11 MIL/uL   Hemoglobin 12.9 12.0 - 15.0 g/dL   HCT 52.8 41.3 - 24.4 %   MCV 85.6 80.0 - 100.0 fL   MCH 30.0 26.0 - 34.0 pg   MCHC 35.1 30.0 - 36.0 g/dL   RDW 01.0 27.2 - 53.6 %   Platelets 331 150 - 400 K/uL   nRBC 0.0 0.0 - 0.2 %  Magnesium     Status: None   Collection Time: 10/02/21  6:52 AM  Result Value Ref Range   Magnesium 2.4 1.7 - 2.4 mg/dL  Phosphorus     Status: None   Collection Time: 10/02/21  6:52 AM  Result Value Ref Range   Phosphorus 3.3 2.5 - 4.6 mg/dL    SUBJECTIVE:  Significant improvement in pain and swallowing per patient.  Did have an odd taste in her mouth last night on left side.  No breathing issues.  Improved swelling.  Still  has some pain with opening of mouth.  Tolerating PO.  OBJECTIVE:  GEN- NAD, sitting upright in bed looking over menu OC/OP- resolution of fluid collection on left medial mandible with small amount of purulence emanating from gingiva.  Improved floor of mouth edema.  Improved left lateral mandibular widening and tenderness NECK-  continued induration of submental and submandibular region, no flucutance  IMPRESSION:  Odontogenic cellulitis  PLAN:  Auto-drained of small abscess.  OK to discharge later today and would recommend Clindamycin as failed outpatient Amoxicillin.  Recommend STerapred DS 6 day taper.  Follow up in my office on Monday or Tuesday.  Continue appointment oral surgery on Jan 16th.  Desiree Young 10/02/2021, 12:04 PM

## 2021-10-02 NOTE — Discharge Summary (Signed)
[facility] [place] at [facility]   PATIENT NAME: Desiree Young    MR#:  [id number]  DATE OF BIRTH:  12/10/1990  DATE OF ADMISSION:  10/01/2021 ADMITTING PHYSICIAN: Alford Highlandichard Verna Hamon, MD  DATE OF DISCHARGE: 10/02/2021  PRIMARY CARE PHYSICIAN: Alan MulderMorayati, Shamil J, MD    ADMISSION DIAGNOSIS:  Dental abscess [K04.7]  DISCHARGE DIAGNOSIS:  Principal Problem:   Dental abscess Active Problems:   Leukocytosis   Tobacco abuse   Facial cellulitis   SECONDARY DIAGNOSIS:   Past Medical History:  Diagnosis Date   Bipolar 1 disorder (HCC)     HOSPITAL COURSE:   1.  Dental abscess, leukocytosis.  Case discussed with ENT Dr. Andee PolesVaught and recommends changing the IV Unasyn over to clindamycin upon discharge home.  Patient was also started on Decadron and I will give a prednisone taper.  Dr. Andee PolesVaught will follow up the patient on Monday.  He stated that the area of swelling from yesterday has drained on its own.  White blood cell count went from 14.8 down to 8.3.  The patient also scheduled a follow-up appointment with the oral surgeon.  Note for work that she was here in the hospital and will be out of work until Tuesday.  We will follow-up with Dr. Andee PolesVaught on Monday.  I am not appreciating any facial cellulitis. 2.  Tobacco abuse 3.  Elevated blood pressure without diagnosis of hypertension.  Patient states that she has never been told that she has high blood pressure.  Recommend following up with PMD and checking blood pressure as outpatient.  Steroids and pain can also make blood pressure rise.  DISCHARGE CONDITIONS:   Satisfactory  CONSULTS OBTAINED:  ENT  DRUG ALLERGIES:   Allergies  Allergen Reactions   Bactrim [Sulfamethoxazole-Trimethoprim] Nausea And Vomiting    DISCHARGE MEDICATIONS:   Allergies as of 10/02/2021       Reactions   Bactrim [sulfamethoxazole-trimethoprim] Nausea And Vomiting        Medication List     STOP taking these medications     amoxicillin 875 MG tablet Commonly known as: AMOXIL       TAKE these medications    acetaminophen 325 MG tablet Commonly known as: Tylenol Take 2 tablets (650 mg total) by mouth every 6 (six) hours as needed for mild pain or moderate pain.   clindamycin 300 MG capsule Commonly known as: CLEOCIN Take 1 capsule (300 mg total) by mouth 3 (three) times daily for 9 days.   oxyCODONE 5 MG immediate release tablet Commonly known as: Oxy IR/ROXICODONE Take 1 tablet (5 mg total) by mouth every 6 (six) hours as needed for up to 3 days for severe pain.   predniSONE 10 MG tablet Commonly known as: DELTASONE 4 tabs po day1,2; 3 tabs po day3,4; 2 tab po day 5; 1 tab po day6 What changed:  medication strength how much to take how to take this when to take this additional instructions         DISCHARGE INSTRUCTIONS:   Follow-up PMD 5 days. Follow-up ENT on Monday  If you experience worsening of your admission symptoms, develop shortness of breath, life threatening emergency, suicidal or homicidal thoughts you must seek medical attention immediately by calling 911 or calling your MD immediately  if symptoms less severe.  You Must read complete instructions/literature along with all the possible adverse reactions/side effects for all the Medicines you take and that have been prescribed to you. Take any new Medicines after you have  completely understood and accept all the possible adverse reactions/side effects.   Please note  You were cared for by a hospitalist during your hospital stay. If you have any questions about your discharge medications or the care you received while you were in the hospital after you are discharged, you can call the unit and asked to speak with the hospitalist on call if the hospitalist that took care of you is not available. Once you are discharged, your primary care physician will handle any further medical issues. Please note that NO REFILLS for any  discharge medications will be authorized once you are discharged, as it is imperative that you return to your primary care physician (or establish a relationship with a primary care physician if you do not have one) for your aftercare needs so that they can reassess your need for medications and monitor your lab values.    Today   CHIEF COMPLAINT:   Chief Complaint  Patient presents with   Abscess    HISTORY OF PRESENT ILLNESS:  Desiree Young  is a 31 y.o. female coming in with a dental abscess   VITAL SIGNS:  Blood pressure (!) 145/99, pulse 69, temperature 98.1 F (36.7 C), temperature source Oral, resp. rate 16, height 5\' 5"  (1.651 m), weight 74.8 kg, last menstrual period 09/05/2021, SpO2 100 %.  I/O:   Intake/Output Summary (Last 24 hours) at 10/02/2021 1528 Last data filed at 10/02/2021 1500 Gross per 24 hour  Intake 879.62 ml  Output --  Net 879.62 ml    PHYSICAL EXAMINATION:  GENERAL:  31 y.o.-year-old patient lying in the bed with no acute distress.  EYES: Pupils equal, round, reactive to light and accommodation. No scleral icterus. Extraocular muscles intact.  HEENT: Head atraumatic, normocephalic. Oropharynx and nasopharynx clear.  Some pain when palpating inside the mouth around her left lower teeth. LUNGS: Normal breath sounds bilaterally, no wheezing, rales,rhonchi or crepitation. No use of accessory muscles of respiration.  CARDIOVASCULAR: S1, S2 normal. No murmurs, rubs, or gallops.  ABDOMEN: Soft, non-tender, non-distended.  EXTREMITIES: No pedal edema.  NEUROLOGIC: Cranial nerves II through XII are intact. Muscle strength 5/5 in all extremities. Sensation intact. Gait not checked.  PSYCHIATRIC: The patient is alert and oriented x 3.  SKIN: No obvious rash, lesion, or ulcer.   DATA REVIEW:   CBC Recent Labs  Lab 10/02/21 0652  WBC 8.3  HGB 12.9  HCT 36.8  PLT 331    Chemistries  Recent Labs  Lab 10/02/21 0652  NA 135  K 4.1  CL 104  CO2 24   GLUCOSE 104*  BUN 9  CREATININE 0.60  CALCIUM 9.2  MG 2.4  AST 14*  ALT 16  ALKPHOS 51  BILITOT 0.7     Microbiology Results  Results for orders placed or performed during the hospital encounter of 10/01/21  Resp Panel by RT-PCR (Flu A&B, Covid) Nasopharyngeal Swab     Status: None   Collection Time: 10/02/21  1:09 AM   Specimen: Nasopharyngeal Swab; Nasopharyngeal(NP) swabs in vial transport medium  Result Value Ref Range Status   SARS Coronavirus 2 by RT PCR NEGATIVE NEGATIVE Final    Comment: (NOTE) SARS-CoV-2 target nucleic acids are NOT DETECTED.  The SARS-CoV-2 RNA is generally detectable in upper respiratory specimens during the acute phase of infection. The lowest concentration of SARS-CoV-2 viral copies this assay can detect is 138 copies/mL. A negative result does not preclude SARS-Cov-2 infection and should not be used as the sole  basis for treatment or other patient management decisions. A negative result may occur with  improper specimen collection/handling, submission of specimen other than nasopharyngeal swab, presence of viral mutation(s) within the areas targeted by this assay, and inadequate number of viral copies(<138 copies/mL). A negative result must be combined with clinical observations, patient history, and epidemiological information. The expected result is Negative.  Fact Sheet for Patients:  BloggerCourse.com  Fact Sheet for Healthcare Providers:  SeriousBroker.it  This test is no t yet approved or cleared by the [place] FDA and  has been authorized for detection and/or diagnosis of SARS-CoV-2 by FDA under an Emergency Use Authorization (EUA). This EUA will remain  in effect (meaning this test can be used) for the duration of the COVID-19 declaration under Section 564(b)(1) of the Act, 21 U.S.C.section 360bbb-3(b)(1), unless the authorization is terminated  or revoked sooner.        Influenza A by PCR NEGATIVE NEGATIVE Final   Influenza B by PCR NEGATIVE NEGATIVE Final    Comment: (NOTE) The Xpert Xpress SARS-CoV-2/FLU/RSV plus assay is intended as an aid in the diagnosis of influenza from Nasopharyngeal swab specimens and should not be used as a sole basis for treatment. Nasal washings and aspirates are unacceptable for Xpert Xpress SARS-CoV-2/FLU/RSV testing.  Fact Sheet for Patients: BloggerCourse.com  Fact Sheet for Healthcare Providers: SeriousBroker.it  This test is not yet approved or cleared by the [place] FDA and has been authorized for detection and/or diagnosis of SARS-CoV-2 by FDA under an Emergency Use Authorization (EUA). This EUA will remain in effect (meaning this test can be used) for the duration of the COVID-19 declaration under Section 564(b)(1) of the Act, 21 U.S.C. section 360bbb-3(b)(1), unless the authorization is terminated or revoked.  Performed at [facility], [address] [place], [place] [address]     RADIOLOGY:  CT Maxillofacial W Contrast  Result Date: 10/01/2021 CLINICAL DATA:  31 year old female with left lower dental pain for over a week. No relief with antibiotics and steroids. Left facial swelling. EXAM: CT MAXILLOFACIAL WITH CONTRAST TECHNIQUE: Multidetector CT imaging of the maxillofacial structures was performed with intravenous contrast. Multiplanar CT image reconstructions were also generated. CONTRAST:  18mL OMNIPAQUE IOHEXOL 300 MG/ML  SOLN COMPARISON:  None. FINDINGS: Osseous: Mandible normally located. Carious right maxillary anterior molar. Carious left maxillary wisdom tooth. Carious left mandible posterior molar with associated periapical lucency, but no obvious mandible dehiscence. Other facial bones appear intact. Intact central skull base and visible cervical vertebrae. Orbits: Intact orbital walls and visible calvarium. Orbits soft tissues  appears symmetric and normal. Sinuses: Clear throughout. Soft tissues: Abnormal inflammation and soft tissue swelling in the left lower masticator space with a slightly serpiginous abnormal hypodense mass along the medial posterior body of the mandible (series 2, image 56 and series 4, image 39) encompassing 14 x 13 x 20 mm compatible with subperiosteal abscess. This is in proximity to the carious left mandible molar, and there is a trace amount of gas in the superior aspect of the collection near the dentition (series 6, image 36). Subsequent mass effect on the left submandibular space and mild mass effect on the central sublingual space. No bona fide sublingual space inflammation, but there is soft tissue edema in the left submandibular space and thickening of the adjacent platysma. Reactive left level 1 B lymph nodes up to 12 mm short axis individually. Reactive left level 2 lymph nodes individually up to 11 mm short axis. And the superficial soft tissue  inflammation wraps around the submental space to the right (series 4, image 45). Superimposed extensive bilateral salivary gland nodularity and sialolithiasis-especially the parotid glands. Negative visible thyroid, larynx, pharynx, retropharyngeal space, right parapharyngeal and right masticator spaces. There is mild inflammation in the left parapharyngeal space. The major vascular structures in the neck and at the skull base remain patent. Limited intracranial: Negative. IMPRESSION: 1. Odontogenic infection with 2 cm Subperiosteal Abscess located at the junction of the posterior left sublingual and anterior left submandibular spaces, adjacent to carious left mandible posterior molar. Regional mass effect, and soft tissue edema in the left submandibular, masticator and parapharyngeal spaces - in addition to superficial cellulitis. Reactive left level 1 and level 2 lymph nodes. 2. Extensive nodularity and sialolithiasis of the bilateral salivary glands. This  appearance can be seen with Sjogren Syndrome and similar systemic inflammatory processes. Electronically Signed   By: Odessa FlemingH  Hall M.D.   On: 10/01/2021 11:13     Management plans discussed with the patient, and she is in agreement.  CODE STATUS:     Code Status Orders  (From admission, onward)           Start     Ordered   10/01/21 1512  Full code  Continuous        10/01/21 1511           Code Status History     Date Active Date Inactive Code Status Order ID Comments User Context   05/17/2016 1606 05/18/2016 1651 Full Code [id number]  Hower, Cletis Athensavid K, MD ED       TOTAL TIME TAKING CARE OF THIS PATIENT: 32 minutes.    Alford Highlandichard Adalyna Godbee M.D on 10/02/2021 at 3:28 PM    [facility]  CC: Primary care physician; Alan MulderMorayati, Shamil J, MD

## 2021-10-02 NOTE — TOC Initial Note (Signed)
Transition of Care [facility]) - Initial/Assessment Note    Patient Details  Name: Desiree Young MRN: [id number] Date of Birth: 1991-04-16  Transition of [facility]) CM/SW Contact:    Caryn Section, RN Phone Number: 10/02/2021, 1:51 PM  Clinical Narrative:    Patient states she has support for transportation at home.  She verbalizes no concerns about getting medications or taking as prescribed.  She has no concerns about returning home at discharge.  Patient verified with financial services that she has a current Financial controller, and she sees PCP Dr. Kerrie Pleasure.  She has no TOC concerns at this time.  TOC contact information provided.               Expected Discharge Plan: Home/Self Care Barriers to Discharge: Barriers Resolved   Patient Goals and CMS Choice     Choice offered to / list presented to : NA  Expected Discharge Plan and Services Expected Discharge Plan: Home/Self Care   Discharge Planning Services: CM Consult Post Acute Care Choice: NA Living arrangements for the past 2 months: Single Family Home Expected Discharge Date: 10/02/21                                    Prior Living Arrangements/Services Living arrangements for the past 2 months: Single Family Home Lives with:: Self Patient language and need for interpreter reviewed:: Yes (no interpreter required) Do you feel safe going back to the place where you live?: Yes      Need for Family Participation in Patient Care: Yes (Comment) Care giver support system in place?: Yes (comment)   Criminal Activity/Legal Involvement Pertinent to Current Situation/Hospitalization: No - Comment as needed  Activities of Daily Living Home Assistive Devices/Equipment: None ADL Screening (condition at time of admission) Patient's cognitive ability adequate to safely complete daily activities?: Yes Is the patient deaf or have difficulty hearing?: No Does the patient have difficulty seeing, even when wearing glasses/contacts?:  No Does the patient have difficulty concentrating, remembering, or making decisions?: No Patient able to express need for assistance with ADLs?: Yes Does the patient have difficulty dressing or bathing?: No Independently performs ADLs?: Yes (appropriate for developmental age) Does the patient have difficulty walking or climbing stairs?: No Weakness of Legs: None Weakness of Arms/Hands: None  Permission Sought/Granted Permission sought to share information with : Case Manager Permission granted to share information with : Yes, Verbal Permission Granted              Emotional Assessment Appearance:: Appears stated age Attitude/Demeanor/Rapport: Gracious, Engaged Affect (typically observed): Pleasant, Appropriate Orientation: : Oriented to Self, Oriented to Place, Oriented to  Time, Oriented to Situation Alcohol / Substance Use: Not Applicable Psych Involvement: No (comment)  Admission diagnosis:  Dental abscess [K04.7] Patient Active Problem List   Diagnosis Date Noted   Dental abscess 10/01/2021   Leukocytosis 10/01/2021   Tobacco abuse 10/01/2021   Facial cellulitis 10/01/2021   AKI (acute kidney injury) (HCC) 05/17/2016   PCP:  Alan Mulder, MD Pharmacy:   [facility] [address], [place] - [address] [place] [address] Phone: [phone] Fax: [phone]     Social Determinants of Health (SDOH) Interventions    Readmission Risk Interventions No flowsheet data found.

## 2021-10-02 NOTE — Progress Notes (Signed)
Received MD order to discharge patient to home.  I reviewed  discharge  instructions, home meds, prescriptions and follow up appointments  with patient and patient verbalized understanding.

## 2022-06-18 DIAGNOSIS — E7800 Pure hypercholesterolemia, unspecified: Secondary | ICD-10-CM | POA: Diagnosis not present

## 2022-06-18 DIAGNOSIS — D110 Benign neoplasm of parotid gland: Secondary | ICD-10-CM | POA: Diagnosis not present

## 2022-06-18 DIAGNOSIS — R87820 Cervical low risk human papillomavirus (HPV) DNA test positive: Secondary | ICD-10-CM | POA: Diagnosis not present

## 2022-06-18 DIAGNOSIS — E040 Nontoxic diffuse goiter: Secondary | ICD-10-CM | POA: Diagnosis not present

## 2022-09-03 DIAGNOSIS — E6609 Other obesity due to excess calories: Secondary | ICD-10-CM | POA: Diagnosis not present

## 2022-09-03 DIAGNOSIS — E040 Nontoxic diffuse goiter: Secondary | ICD-10-CM | POA: Diagnosis not present

## 2022-09-03 DIAGNOSIS — E7800 Pure hypercholesterolemia, unspecified: Secondary | ICD-10-CM | POA: Diagnosis not present

## 2022-09-03 DIAGNOSIS — D110 Benign neoplasm of parotid gland: Secondary | ICD-10-CM | POA: Diagnosis not present

## 2023-01-18 ENCOUNTER — Encounter: Payer: Self-pay | Admitting: Gastroenterology

## 2023-01-18 ENCOUNTER — Other Ambulatory Visit: Payer: Self-pay

## 2023-01-18 ENCOUNTER — Ambulatory Visit (INDEPENDENT_AMBULATORY_CARE_PROVIDER_SITE_OTHER): Payer: BC Managed Care – PPO | Admitting: Gastroenterology

## 2023-01-18 ENCOUNTER — Telehealth: Payer: Self-pay

## 2023-01-18 VITALS — BP 160/109 | HR 67 | Temp 97.9°F | Ht 65.0 in | Wt 165.2 lb

## 2023-01-18 DIAGNOSIS — R1030 Lower abdominal pain, unspecified: Secondary | ICD-10-CM

## 2023-01-18 DIAGNOSIS — N943 Premenstrual tension syndrome: Secondary | ICD-10-CM

## 2023-01-18 NOTE — Telephone Encounter (Signed)
Please inform patient that the CT scan has been denied by her insurance.  Recommend referral to OB/GYN for lower abdominal pain, premenstrual syndrome Recommend healthy diet and exercise, high-fiber and MiraLAX.  If her symptoms are persistent despite these, she should reach out to [place] in 3 months  RV

## 2023-01-18 NOTE — Telephone Encounter (Signed)
CT scan for abdominal and pelvis got denied and it is schedule for 01/26/2023. A peer to peer needs to be done. Phone number is [phone]. Case ID [id number].

## 2023-01-18 NOTE — Addendum Note (Signed)
Addended by: Radene Knee L on: 01/18/2023 03:14 PM   Modules accepted: Orders

## 2023-01-18 NOTE — Telephone Encounter (Signed)
Placed referral to OBGYN. Called central scheduling and cancel the CT scan for patient. Called patient and left a message for call back

## 2023-01-18 NOTE — Telephone Encounter (Signed)
Called patient and patient verbalized understanding of instructions and of the cancel CT scan.

## 2023-01-18 NOTE — Progress Notes (Signed)
Arlyss Repress, MD [address]  [address]  [place], [place] [address]  Main: [phone]  Fax: [phone]    Gastroenterology Consultation  Referring Provider:     Alan Mulder, MD Primary Care Physician:  Alan Mulder, MD Primary Gastroenterologist:  Dr. Arlyss Repress Reason for Consultation: Lower abdominal pain        HPI:   Desiree Young is a 32 y.o. female referred by Dr. Patrecia Pace, Delsa Sale, MD  for consultation & management of lower abdominal pain.  Patient reports that approximately for last 1 year she has been experiencing lower abdominal gas pain which usually starts around her menstrual cycle and resolves in 2 to 3 days after her  period.  She also reports having irregular bowel movements around this time. She takes Ex-Lax which helps move her bowel.  Her pain resolves with bowel movement.  She does admit to not eating healthy.  Drinks carbonated beverages daily.  He does vape nicotine.  Denies any rectal bleeding.  Reports having regular bowel movements in between these episodes.  Patient denies any abdominal pain today, currently menstruating  She reports that she had an ultrasound through her PCP which was normal  NSAIDs: None  Antiplts/Anticoagulants/Anti thrombotics: None  GI Procedures: None  Past Medical History:  Diagnosis Date   Bipolar 1 disorder     Past Surgical History:  Procedure Laterality Date   INCISION AND DRAINAGE PERIRECTAL ABSCESS  05/10/2013   KELOID EXCISION     Ear    No current outpatient medications on file.   Family History  Problem Relation Age of Onset   Hypertension Father    Hypertension Maternal Grandmother    Hypertension Paternal Grandmother    Diabetes Paternal Grandmother    Thyroid disease Paternal Grandmother      Social History   Tobacco Use   Smoking status: Former    Types: Cigarettes   Smokeless tobacco: Never  Substance Use Topics   Alcohol use: Not Currently    Allergies as of  01/18/2023 - Review Complete 10/01/2021  Allergen Reaction Noted   Bactrim [sulfamethoxazole-trimethoprim] Nausea And Vomiting 05/24/2016    Review of Systems:    All systems reviewed and negative except where noted in HPI.   Physical Exam:  BP (!) 160/109 (BP Location: Right Arm, Patient Position: Sitting, Cuff Size: Normal)   Pulse 67   Temp 97.9 F (36.6 C) (Oral)   Ht  (1.651 m)   Wt 165 lb 4 oz (75 kg)   BMI 27.50 kg/m  No LMP recorded.  General:   Alert,  Well-developed, well-nourished, pleasant and cooperative in NAD Head:  Normocephalic and atraumatic. Eyes:  Sclera clear, no icterus.   Conjunctiva pink. Ears:  Normal auditory acuity. Nose:  No deformity, discharge, or lesions. Mouth:  No deformity or lesions,oropharynx pink & moist. Neck:  Supple; no masses or thyromegaly. Lungs:  Respirations even and unlabored.  Clear throughout to auscultation.   No wheezes, crackles, or rhonchi. No acute distress. Heart:  Regular rate and rhythm; no murmurs, clicks, rubs, or gallops. Abdomen:  Normal bowel sounds. Soft, non-tender and non-distended without masses, hepatosplenomegaly or hernias noted.  No guarding or rebound tenderness.   Rectal: Not performed Msk:  Symmetrical without gross deformities. Good, equal movement & strength bilaterally. Pulses:  Normal pulses noted. Extremities:  No clubbing or edema.  No cyanosis. Neurologic:  Alert and oriented x3;  grossly normal neurologically. Skin:  Intact without  significant lesions or rashes. No jaundice. Psych:  Alert and cooperative. Normal mood and affect.  Imaging Studies: No abdominal imaging  Assessment and Plan:   Desiree Young is a 32 y.o. pleasant African-American female with no significant past medical history, is seen in consultation for 1 year history of periodic episodes of lower abdominal gas pain associated with irregular bowel movements around the menstrual cycle.  Her symptoms are most likely secondary to  premenstrual syndrome.  Discussed regarding healthy eating habits, avoidance of carbonated beverages, high-fiber diet, MiraLAX as needed during the menstrual cycle instead of laxative use. Discussed about CT abdomen and pelvis with IV contrast.  She may need colonoscopy based on the CT findings Also, discussed about seeing an OB/GYN after the CT scan results   Follow up based on the above workup   Arlyss Repress, MD

## 2023-01-18 NOTE — Patient Instructions (Addendum)
Your CT scan is scheduled for you on 01/26/2023 at out patient imaging arrive at 2:45pm for a 3:00pm scan. Nothing to eat or drink 4 hours before the scan. Address is [address] [place], [place] [address]. Phone number is [phone]. If you need to rescheduled please call [phone] option 3 and then option 2.   High-Fiber Eating Plan Fiber, also called dietary fiber, is a type of carbohydrate. It is found foods such as fruits, vegetables, whole grains, and beans. A high-fiber diet can have many health benefits. Your health care provider may recommend a high-fiber diet to help: Prevent constipation. Fiber can make your bowel movements more regular. Lower your cholesterol. Relieve the following conditions: Inflammation of veins in the anus (hemorrhoids). Inflammation of specific areas of the digestive tract (uncomplicated diverticulosis). A problem of the large intestine, also called the colon, that sometimes causes pain and diarrhea (irritable bowel syndrome, or IBS). Prevent overeating as part of a weight-loss plan. Prevent heart disease, type 2 diabetes, and certain cancers. What are tips for following this plan? Reading food labels  Check the nutrition facts label on food products for the amount of dietary fiber. Choose foods that have 5 grams of fiber or more per serving. The goals for recommended daily fiber intake include: Men (age 75 or younger): 34-38 g. Men (over age 81): 28-34 g. Women (age 76 or younger): 25-28 g. Women (over age 62): 22-25 g. Your daily fiber goal is _____________ g. Shopping Choose whole fruits and vegetables instead of processed forms, such as apple juice or applesauce. Choose a wide variety of high-fiber foods such as avocados, lentils, oats, and kidney beans. Read the nutrition facts label of the foods you choose. Be aware of foods with added fiber. These foods often have high sugar and sodium amounts per serving. Cooking Use whole-grain  flour for baking and cooking. Cook with brown rice instead of white rice. Meal planning Start the day with a breakfast that is high in fiber, such as a cereal that contains 5 g of fiber or more per serving. Eat breads and cereals that are made with whole-grain flour instead of refined flour or white flour. Eat brown rice, bulgur wheat, or millet instead of white rice. Use beans in place of meat in soups, salads, and pasta dishes. Be sure that half of the grains you eat each day are whole grains. General information You can get the recommended daily intake of dietary fiber by: Eating a variety of fruits, vegetables, grains, nuts, and beans. Taking a fiber supplement if you are not able to take in enough fiber in your diet. It is better to get fiber through food than from a supplement. Gradually increase how much fiber you consume. If you increase your intake of dietary fiber too quickly, you may have bloating, cramping, or gas. Drink plenty of water to help you digest fiber. Choose high-fiber snacks, such as berries, raw vegetables, nuts, and popcorn. What foods should I eat? Fruits Berries. Pears. Apples. Oranges. Avocado. Prunes and raisins. Dried figs. Vegetables Sweet potatoes. Spinach. Kale. Artichokes. Cabbage. Broccoli. Cauliflower. Green peas. Carrots. Squash. Grains Whole-grain breads. Multigrain cereal. Oats and oatmeal. Brown rice. Barley. Bulgur wheat. Millet. Quinoa. Bran muffins. Popcorn. Rye wafer crackers. Meats and other proteins Navy beans, kidney beans, and pinto beans. Soybeans. Split peas. Lentils. Nuts and seeds. Dairy Fiber-fortified yogurt. Beverages Fiber-fortified soy milk. Fiber-fortified orange juice. Other foods Fiber bars. The items listed above may not be a complete list of  recommended foods and beverages. Contact a dietitian for more information. What foods should I avoid? Fruits Fruit juice. Cooked, strained fruit. Vegetables Fried potatoes. Canned  vegetables. Well-cooked vegetables. Grains White bread. Pasta made with refined flour. White rice. Meats and other proteins Fatty cuts of meat. Fried chicken or fried fish. Dairy Milk. Yogurt. Cream cheese. Sour cream. Fats and oils Butters. Beverages Soft drinks. Other foods Cakes and pastries. The items listed above may not be a complete list of foods and beverages to avoid. Talk with your dietitian about what choices are best for you. Summary Fiber is a type of carbohydrate. It is found in foods such as fruits, vegetables, whole grains, and beans. A high-fiber diet has many benefits. It can help to prevent constipation, lower blood cholesterol, aid weight loss, and reduce your risk of heart disease, diabetes, and certain cancers. Increase your intake of fiber gradually. Increasing fiber too quickly may cause cramping, bloating, and gas. Drink plenty of water while you increase the amount of fiber you consume. The best sources of fiber include whole fruits and vegetables, whole grains, nuts, seeds, and beans. This information is not intended to replace advice given to you by your health care provider. Make sure you discuss any questions you have with your health care provider. Document Revised: 01/17/2020 Document Reviewed: 01/17/2020 Elsevier Patient Education  2023 [employer].

## 2023-01-24 ENCOUNTER — Encounter: Payer: Self-pay | Admitting: Obstetrics and Gynecology

## 2023-01-26 ENCOUNTER — Ambulatory Visit: Payer: BC Managed Care – PPO

## 2024-04-20 ENCOUNTER — Encounter: Payer: Self-pay | Admitting: Physician Assistant

## 2024-04-20 ENCOUNTER — Ambulatory Visit (INDEPENDENT_AMBULATORY_CARE_PROVIDER_SITE_OTHER): Admitting: Physician Assistant

## 2024-04-20 VITALS — BP 141/114 | HR 110 | Ht 65.0 in | Wt 157.8 lb

## 2024-04-20 DIAGNOSIS — Z1159 Encounter for screening for other viral diseases: Secondary | ICD-10-CM

## 2024-04-20 DIAGNOSIS — Z7689 Persons encountering health services in other specified circumstances: Secondary | ICD-10-CM

## 2024-04-20 DIAGNOSIS — I10 Essential (primary) hypertension: Secondary | ICD-10-CM

## 2024-04-20 DIAGNOSIS — E049 Nontoxic goiter, unspecified: Secondary | ICD-10-CM | POA: Diagnosis not present

## 2024-04-20 DIAGNOSIS — R5383 Other fatigue: Secondary | ICD-10-CM | POA: Diagnosis not present

## 2024-04-20 DIAGNOSIS — M25562 Pain in left knee: Secondary | ICD-10-CM

## 2024-04-20 DIAGNOSIS — G8929 Other chronic pain: Secondary | ICD-10-CM | POA: Insufficient documentation

## 2024-04-20 DIAGNOSIS — M25561 Pain in right knee: Secondary | ICD-10-CM

## 2024-04-20 MED ORDER — VALSARTAN 40 MG PO TABS: 40.0000 mg | ORAL_TABLET | Freq: Every day | ORAL | 3 refills | Status: DC

## 2024-04-20 MED ORDER — HYDROCHLOROTHIAZIDE 12.5 MG PO CAPS: 12.5000 mg | ORAL_CAPSULE | Freq: Every day | ORAL | 1 refills | Status: DC

## 2024-04-20 NOTE — Progress Notes (Signed)
 New patient visit  Patient: Desiree Young   DOB: 07-18-1991   33 y.o. Female  MRN: [id number] Visit Date: 04/20/2024  Today's healthcare provider: Jolynn Spencer, PA-C   Chief Complaint  Patient presents with   New Patient (Initial Visit)    Patient is present to establish care and discuss knee pain/weakness.    Knee Pain    Bilateral knee pain X 6 months. She reports pain is worsening over the last 2 months. Describes as a sharp pain and when having the pain it is a 7/10. Pain is present at least 5 times a week when walking at work for at least half of her shift. Associated with swelling. She reports taking four tylenol  extra strength once a day. Not related to any accident.    Care Management    Hepatitis C Screening - yes Cervical Cancer Screening - last year Dr.Morayati Pneumococcal Vaccine (smoker) - yes HPV Vaccines - Yes  Hepatitis B Vaccines -    Subjective    Desiree Young is a 33 y.o. female who presents today as a new patient to establish care.   Discussed the use of AI scribe software for clinical note transcription with the patient, who gave verbal consent to proceed.  History of Present Illness Desiree Young is a 33 year old female who presents for evaluation of hypertension and knee pain.  She experiences elevated blood pressure readings and is not on antihypertensive medication. Her previous endocrinologist retired before her follow-up, and she has not received treatment for hypertension.  Knee pain has persisted for six months, worsening recently, and is more pronounced in one knee but affects both. Her job involves walking and lifting, and the pain sometimes disturbs her sleep. She uses Tylenol  for relief.  Her last blood work was over two years ago, and she has not had a Pap smear in at least a year. She does not have an OB-GYN and previously had Pap smears done by her endocrinologist.    Past Medical History:  Diagnosis Date   AKI (acute kidney  injury) (HCC) 05/17/2016   Anxiety 2014   Bipolar 1 disorder (HCC)    Dental abscess 10/01/2021   Depression 2014   Around that time.   Past Surgical History:  Procedure Laterality Date   INCISION AND DRAINAGE PERIRECTAL ABSCESS  05/10/2013   KELOID EXCISION     Ear   Family Status  Relation Name Status   Mother  Alive   Father  Alive   MGM Clarita Gavel Alive   PGM Drucella Molt Alive  No partnership data on file   Family History  Problem Relation Age of Onset   Hypertension Father    Hypertension Maternal Grandmother    Stroke Maternal Grandmother    Hypertension Paternal Grandmother    Diabetes Paternal Grandmother    Thyroid disease Paternal Grandmother    Social History   Socioeconomic History   Marital status: Single    Spouse name: Not on file   Number of children: Not on file   Years of education: Not on file   Highest education level: 12th grade  Occupational History   Not on file  Tobacco Use   Smoking status: Every Day    Types: E-cigarettes   Smokeless tobacco: Never  Substance and Sexual Activity   Alcohol use: Yes   Drug use: Never   Sexual activity: Yes    Birth control/protection: None  Other Topics Concern   Not on file  Social History Narrative   Not on file   Social Drivers of Health   Financial Resource Strain: Low Risk  (04/16/2024)   Overall Financial Resource Strain (CARDIA)    Difficulty of Paying Living Expenses: Not very hard  Food Insecurity: No Food Insecurity (04/16/2024)   Hunger Vital Sign    Worried About Running Out of Food in the Last Year: Never true    Ran Out of Food in the Last Year: Never true  Transportation Needs: No Transportation Needs (04/16/2024)   PRAPARE - Administrator, Civil Service (Medical): No    Lack of Transportation (Non-Medical): No  Physical Activity: Unknown (04/16/2024)   Exercise Vital Sign    Days of Exercise per Week: Patient declined    Minutes of Exercise per Session: Not on file   Stress: No Stress Concern Present (04/16/2024)   [employer] of Occupational Health - Occupational Stress Questionnaire    Feeling of Stress: Only a little  Social Connections: Socially Isolated (04/16/2024)   Social Connection and Isolation Panel    Frequency of Communication with Friends and Family: Once a week    Frequency of Social Gatherings with Friends and Family: Never    Attends Religious Services: Never    Database administrator or Organizations: No    Attends Engineer, structural: Not on file    Marital Status: Living with partner   No outpatient medications prior to visit.   No facility-administered medications prior to visit.   Allergies  Allergen Reactions   Bactrim  [Sulfamethoxazole -Trimethoprim ] Nausea And Vomiting    Immunization History  Administered Date(s) Administered   DTP 05/31/1990, 05/26/1993, 07/28/1993, 06/15/1994   Hep B, Unspecified 07/21/2001, 08/18/2001, 01/26/2002   MMR 05/31/1990, 05/26/1993   OPV 05/31/1990, 05/26/1993, 07/28/1993   Tdap 05/04/2020    Health Maintenance  Topic Date Due   Hepatitis C Screening  Never done   Pneumococcal Vaccine 58-35 Years old (1 of 2 - PCV) Never done   Hepatitis B Vaccines (1 of 3 - 19+ 3-dose series) Never done   HPV VACCINES (1 - 3-dose SCDM series) Never done   Cervical Cancer Screening (HPV/Pap Cotest)  Never done   COVID-19 Vaccine (1 - 2024-25 season) 05/06/2024 (Originally 05/29/2023)   INFLUENZA VACCINE  04/27/2024   DTaP/Tdap/Td (6 - Td or Tdap) 05/04/2030   HIV Screening  Completed   Meningococcal B Vaccine  Aged Out    Patient Care Team: Makayli Bracken, PA-C as PCP - General (Physician Assistant)  Review of Systems  All other systems reviewed and are negative.  Except see HPI       Objective    BP (!) 141/114 (BP Location: Left Arm, Patient Position: Sitting, Cuff Size: Normal)   Pulse (!) 110   Ht 5' 5 (1.651 m)   Wt 157 lb 12.8 oz (71.6 kg)   SpO2 100%   BMI  26.26 kg/m     Physical Exam Vitals reviewed.  Constitutional:      General: She is not in acute distress.    Appearance: Normal appearance. She is well-developed. She is not diaphoretic.  HENT:     Head: Normocephalic and atraumatic.  Eyes:     General: No scleral icterus.    Conjunctiva/sclera: Conjunctivae normal.  Neck:     Thyroid: No thyromegaly.  Cardiovascular:     Rate and Rhythm: Normal rate and regular rhythm.     Pulses: Normal pulses.     Heart sounds: Normal heart  sounds. No murmur heard. Pulmonary:     Effort: Pulmonary effort is normal. No respiratory distress.     Breath sounds: Normal breath sounds. No wheezing, rhonchi or rales.  Musculoskeletal:     Cervical back: Neck supple.     Right lower leg: No edema.     Left lower leg: No edema.  Lymphadenopathy:     Cervical: No cervical adenopathy.  Skin:    General: Skin is warm and dry.     Findings: No rash.  Neurological:     Mental Status: She is alert and oriented to person, place, and time. Mental status is at baseline.  Psychiatric:        Mood and Affect: Mood normal.        Behavior: Behavior normal.     Depression Screen    04/20/2024    9:25 AM  PHQ 2/9 Scores  PHQ - 2 Score 4  PHQ- 9 Score 12   No results found for any visits on 04/20/24.  Assessment & Plan      Assessment & Plan Hypertension Newly diagnosed, was diagnosed in the past with reading of 160/109 Hypertension with elevated readings. Initiation of two separate medications necessary to identify adverse reactions. Emphasized home monitoring. - Prescribe two separate antihypertensive medications. - Instruct to measure blood pressure twice daily and record readings. - Schedule follow-up in two weeks to assess blood pressure control and medication efficacy. - Order comprehensive blood work. - Advise dietary modifications, including reducing salt intake and increasing water consumption.  Knee Pain Bilateral knee pain,  possible arthritis. Pain interferes with sleep. Discussed non-pharmacological interventions. - Refer to orthopedics for further evaluation and possible imaging. - Recommend continuation of acetaminophen  for pain management. - Advise use of over-the-counter diclofenac gel. - Suggest daily stretching exercises and use of heat or ice therapy as needed. - Suggest stretching before work.  Seasonal Allergies Nasal drainage with occasional loratadine use. Discussed cost-effective alternatives. - Recommend nasal saline rinse or spray for symptom relief. OTC antihistamines and flonase.  General Health Maintenance Uncertain hepatitis B vaccination status. Last blood work over two years ago. No recent eye examination. No current OBGYN provider and uncertain Pap smear history. Discussed importance of obtaining previous medical records. - Contact previous provider for immunization records, especially hepatitis B. - Order hepatitis C screening. - Encourage scheduling an eye examination. - Obtain previous medical records, including Pap smear history.  Follow-up Follow-up necessary to monitor blood pressure management and evaluate medication effectiveness. - Schedule follow-up in two weeks to review blood pressure readings and medication effects. - Ensure follow-up with orthopedics for knee pain evaluation.  Encounter to establish care Welcomed to our clinic Reviewed past medical hx, social hx, family hx and surgical hx Pt advised to send all vaccination records or screening Will proceed with vaccinations at the follow-up as she is not sure which were done.  Primary hypertension (Primary)  - valsartan (DIOVAN) 40 MG tablet; Take 1 tablet (40 mg total) by mouth daily.  Dispense: 90 tablet; Refill: 3 - hydrochlorothiazide (MICROZIDE) 12.5 MG capsule; Take 1 capsule (12.5 mg total) by mouth daily.  Dispense: 30 capsule; Refill: 1 - CBC with Differential/Platelet - Comprehensive metabolic panel with  GFR - Lipid panel  Goiter - TSH  Other fatigue Chronic and stable Workup ordered - CBC with Differential/Platelet - Comprehensive metabolic panel with GFR - Hemoglobin A1c - Lipid panel - TSH Will follow-up  Need for hepatitis C screening test - Hepatitis C antibody  Chronic pain of both knees - Ambulatory referral to Orthopedics  Pt is smoking, will check tobacco use and vaccination at the follow-up  Return in about 2 weeks (around 05/04/2024).    The patient was advised to call back or seek an in-person evaluation if the symptoms worsen or if the condition fails to improve as anticipated.  I discussed the assessment and treatment plan with the patient. The patient was provided an opportunity to ask questions and all were answered. The patient agreed with the plan and demonstrated an understanding of the instructions.  I, Georgianne Gritz, PA-C have reviewed all documentation for this visit. The documentation on  04/20/2024   for the exam, diagnosis, procedures, and orders are all accurate and complete.  Jolynn Spencer, [facility], MMS [facility] [phone] (phone) [phone] (fax)  [facility]

## 2024-05-04 ENCOUNTER — Ambulatory Visit: Admitting: Physician Assistant

## 2024-05-07 DIAGNOSIS — L239 Allergic contact dermatitis, unspecified cause: Secondary | ICD-10-CM | POA: Diagnosis not present

## 2024-05-23 DIAGNOSIS — M222X1 Patellofemoral disorders, right knee: Secondary | ICD-10-CM | POA: Diagnosis not present

## 2024-05-23 DIAGNOSIS — M222X2 Patellofemoral disorders, left knee: Secondary | ICD-10-CM | POA: Diagnosis not present

## 2024-05-31 ENCOUNTER — Encounter: Payer: Self-pay | Admitting: Physician Assistant

## 2024-05-31 ENCOUNTER — Ambulatory Visit (INDEPENDENT_AMBULATORY_CARE_PROVIDER_SITE_OTHER): Admitting: Physician Assistant

## 2024-05-31 VITALS — BP 142/105 | HR 98 | Temp 99.1°F | Ht 65.0 in | Wt 158.8 lb

## 2024-05-31 DIAGNOSIS — I10 Essential (primary) hypertension: Secondary | ICD-10-CM | POA: Insufficient documentation

## 2024-05-31 DIAGNOSIS — G8929 Other chronic pain: Secondary | ICD-10-CM

## 2024-05-31 DIAGNOSIS — R5383 Other fatigue: Secondary | ICD-10-CM

## 2024-05-31 DIAGNOSIS — Z720 Tobacco use: Secondary | ICD-10-CM

## 2024-05-31 DIAGNOSIS — E049 Nontoxic goiter, unspecified: Secondary | ICD-10-CM

## 2024-05-31 DIAGNOSIS — M25562 Pain in left knee: Secondary | ICD-10-CM

## 2024-05-31 DIAGNOSIS — M25561 Pain in right knee: Secondary | ICD-10-CM

## 2024-05-31 NOTE — Progress Notes (Signed)
 Established patient visit  Patient: Desiree Young   DOB: 08-28-1991   33 y.o. Female  MRN: [id number] Visit Date: 05/31/2024  Today's healthcare provider: Jolynn Spencer, PA-C   Chief Complaint  Patient presents with   Medical Management of Chronic Issues    Patient present for 2 week f/u, reports that she never picked up medications valsartan  and hydrochlorothiazide  because she read side effects and decided not to take them.    Subjective     HPI     Medical Management of Chronic Issues    Additional comments: Patient present for 2 week f/u, reports that she never picked up medications valsartan  and hydrochlorothiazide  because she read side effects and decided not to take them.       Last edited by Cherry Chiquita HERO, CMA on 05/31/2024  1:45 PM.       Discussed the use of AI scribe software for clinical note transcription with the patient, who gave verbal consent to proceed.  History of Present Illness Desiree Young is a 33 year old female with hypertension who presents for a follow-up visit and to receive a flu shot. She is accompanied by her boyfriend of 17 years.  She has hypertension with previous readings as high as 160/108 mmHg. Her last systolic reading in July was 160 mmHg. She has been prescribed valsartan  and hydrochlorothiazide  but has not started them. Current readings are around 140/94-95 mmHg.  She smokes three to four cigars daily and consumes a high-salt diet. She experiences anxiety in crowded situations.  No visual problems, chest pain, shortness of breath, rapid heartbeat, or swelling. Soft bowel movements are present without pain, nausea, or vomiting.       05/31/2024    1:47 PM 04/20/2024    9:25 AM  Depression screen PHQ 2/9  Decreased Interest 2 2  Down, Depressed, Hopeless 1 2  PHQ - 2 Score 3 4  Altered sleeping 2 2  Tired, decreased energy 1 2  Change in appetite 0 0  Feeling bad or failure about yourself  0 2  Trouble concentrating 0 1   Moving slowly or fidgety/restless 0 1  Suicidal thoughts 0 0  PHQ-9 Score 6 12  Difficult doing work/chores Not difficult at all Not difficult at all      05/31/2024    1:47 PM 04/20/2024    9:25 AM  GAD 7 : Generalized Anxiety Score  Nervous, Anxious, on Edge 1 2  Control/stop worrying 1 2  Worry too much - different things 2 2  Trouble relaxing 1 1  Restless 0 1  Easily annoyed or irritable 1 1  Afraid - awful might happen 2 2  Total GAD 7 Score 8 11  Anxiety Difficulty Not difficult at all Not difficult at all    Medications: Outpatient Medications Prior to Visit  Medication Sig   meloxicam (MOBIC) 7.5 MG tablet Take 7.5 mg by mouth 2 (two) times daily.   hydrochlorothiazide  (MICROZIDE ) 12.5 MG capsule Take 1 capsule (12.5 mg total) by mouth daily. (Patient not taking: Reported on 05/31/2024)   valsartan  (DIOVAN ) 40 MG tablet Take 1 tablet (40 mg total) by mouth daily. (Patient not taking: Reported on 05/31/2024)   No facility-administered medications prior to visit.    Review of Systems All negative Except see HPI       Objective    BP (!) 153/108 (BP Location: Right Arm, Patient Position: Sitting, Cuff Size: Normal)   Pulse 98   Temp 99.1  F (37.3 C) (Oral)   Ht 5' 5 (1.651 m)   Wt 158 lb 12.8 oz (72 kg)   LMP 05/27/2024 (Exact Date)   SpO2 100%   BMI 26.43 kg/m     Physical Exam Vitals reviewed.  Constitutional:      General: She is not in acute distress.    Appearance: Normal appearance. She is well-developed. She is not diaphoretic.  HENT:     Head: Normocephalic and atraumatic.  Eyes:     General: No scleral icterus.    Conjunctiva/sclera: Conjunctivae normal.  Neck:     Thyroid: No thyromegaly.  Cardiovascular:     Rate and Rhythm: Normal rate and regular rhythm.     Pulses: Normal pulses.     Heart sounds: Normal heart sounds. No murmur heard. Pulmonary:     Effort: Pulmonary effort is normal. No respiratory distress.     Breath sounds:  Normal breath sounds. No wheezing, rhonchi or rales.  Musculoskeletal:     Cervical back: Neck supple.     Right lower leg: No edema.     Left lower leg: No edema.  Lymphadenopathy:     Cervical: No cervical adenopathy.  Skin:    General: Skin is warm and dry.     Findings: No rash.  Neurological:     Mental Status: She is alert and oriented to person, place, and time. Mental status is at baseline.  Psychiatric:        Mood and Affect: Mood normal.        Behavior: Behavior normal.      No results found for any visits on 05/31/24.      Assessment & Plan Essential hypertension Chronic hypertension with elevated readings and family history of stroke. Risk of cardiovascular and organ damage if untreated. - Prescribed valsartan  for evening use. - Prescribed hydrochlorothiazide  for morning use. - Instructed to monitor blood pressure daily, morning and evening. - Ordered baseline blood work for kidney function and electrolytes. - Advised to reduce salt intake and avoid spicy foods. - Scheduled follow-up in one month with blood pressure records.  Right knee pain Chronic knee pain managed with meloxicam. Concern for NSAID impact on kidney function, especially with hypertension. - Continue meloxicam as needed. - Monitor kidney function through blood work.  Tobacco use Current use of cigars contributing to hypertension and cardiovascular risk. - Advised smoking cessation to reduce cardiovascular risk. Pt needs to proceed with labs from 04/20/24  Goiter Found on PE Will need TSH and t4 Consider US  thyroid  Will follow-up  Other fatigue Chronic Lab work should be completed from 04/20/24 Advised healthy diet and regular exercise Will follow-up   No orders of the defined types were placed in this encounter.   No follow-ups on file.   The patient was advised to call back or seek an in-person evaluation if the symptoms worsen or if the condition fails to improve as  anticipated.  I discussed the assessment and treatment plan with the patient. The patient was provided an opportunity to ask questions and all were answered. The patient agreed with the plan and demonstrated an understanding of the instructions.  I, Alessander Sikorski, PA-C have reviewed all documentation for this visit. The documentation on 05/31/2024  for the exam, diagnosis, procedures, and orders are all accurate and complete.  Jolynn Spencer, [facility], MMS [facility] [phone] (phone) [phone] (fax)  [facility]

## 2024-06-01 ENCOUNTER — Ambulatory Visit: Payer: Self-pay | Admitting: Physician Assistant

## 2024-06-01 LAB — COMPREHENSIVE METABOLIC PANEL WITH GFR
ALT: 16 IU/L (ref 0–32)
AST: 19 IU/L (ref 0–40)
Albumin: 4 g/dL (ref 3.9–4.9)
Alkaline Phosphatase: 69 IU/L (ref 44–121)
BUN/Creatinine Ratio: 8 — ABNORMAL LOW (ref 9–23)
BUN: 6 mg/dL (ref 6–20)
Bilirubin Total: 0.4 mg/dL (ref 0.0–1.2)
CO2: 22 mmol/L (ref 20–29)
Calcium: 9.4 mg/dL (ref 8.7–10.2)
Chloride: 104 mmol/L (ref 96–106)
Creatinine, Ser: 0.74 mg/dL (ref 0.57–1.00)
Globulin, Total: 3.9 g/dL (ref 1.5–4.5)
Glucose: 74 mg/dL (ref 70–99)
Potassium: 4.8 mmol/L (ref 3.5–5.2)
Sodium: 138 mmol/L (ref 134–144)
Total Protein: 7.9 g/dL (ref 6.0–8.5)
eGFR: 108 mL/min/1.73 (ref >59)

## 2024-06-01 LAB — CBC WITH DIFFERENTIAL/PLATELET
Basophils Absolute: 0 x10E3/uL (ref 0.0–0.2)
Basos: 0 %
EOS (ABSOLUTE): 0.6 x10E3/uL — ABNORMAL HIGH (ref 0.0–0.4)
Eos: 8 %
Hematocrit: 37.6 % (ref 34.0–46.6)
Hemoglobin: 12.1 g/dL (ref 11.1–15.9)
Immature Grans (Abs): 0 x10E3/uL (ref 0.0–0.1)
Immature Granulocytes: 0 %
Lymphocytes Absolute: 1.6 x10E3/uL (ref 0.7–3.1)
Lymphs: 20 %
MCH: 28.1 pg (ref 26.6–33.0)
MCHC: 32.2 g/dL (ref 31.5–35.7)
MCV: 87 fL (ref 79–97)
Monocytes Absolute: 0.3 x10E3/uL (ref 0.1–0.9)
Monocytes: 4 %
Neutrophils Absolute: 5.2 x10E3/uL (ref 1.4–7.0)
Neutrophils: 68 %
Platelets: 289 x10E3/uL (ref 150–450)
RBC: 4.31 x10E6/uL (ref 3.77–5.28)
RDW: 13.2 % (ref 11.7–15.4)
WBC: 7.7 x10E3/uL (ref 3.4–10.8)

## 2024-06-01 LAB — LIPID PANEL
Chol/HDL Ratio: 4.6 ratio — ABNORMAL HIGH (ref 0.0–4.4)
Cholesterol, Total: 144 mg/dL (ref 100–199)
HDL: 31 mg/dL — ABNORMAL LOW (ref >39)
LDL Chol Calc (NIH): 86 mg/dL (ref 0–99)
Triglycerides: 155 mg/dL — ABNORMAL HIGH (ref 0–149)
VLDL Cholesterol Cal: 27 mg/dL (ref 5–40)

## 2024-06-01 LAB — HEMOGLOBIN A1C
Est. average glucose Bld gHb Est-mCnc: 108 mg/dL
Hgb A1c MFr Bld: 5.4 % (ref 4.8–5.6)

## 2024-06-01 LAB — TSH: TSH: 1.21 u[IU]/mL (ref 0.450–4.500)

## 2024-06-07 ENCOUNTER — Ambulatory Visit: Admitting: Physician Assistant

## 2024-06-17 ENCOUNTER — Emergency Department
Admission: EM | Admit: 2024-06-17 | Discharge: 2024-06-17 | Disposition: A | Discharge status: Home / Self Care | Attending: Emergency Medicine | Admitting: Emergency Medicine

## 2024-06-17 ENCOUNTER — Other Ambulatory Visit: Payer: Self-pay

## 2024-06-17 DIAGNOSIS — L299 Pruritus, unspecified: Secondary | ICD-10-CM | POA: Diagnosis not present

## 2024-06-17 DIAGNOSIS — R899 Unspecified abnormal finding in specimens from other organs, systems and tissues: Secondary | ICD-10-CM

## 2024-06-17 DIAGNOSIS — R799 Abnormal finding of blood chemistry, unspecified: Secondary | ICD-10-CM | POA: Diagnosis not present

## 2024-06-17 DIAGNOSIS — R7401 Elevation of levels of liver transaminase levels: Secondary | ICD-10-CM | POA: Diagnosis not present

## 2024-06-17 DIAGNOSIS — I10 Essential (primary) hypertension: Secondary | ICD-10-CM | POA: Insufficient documentation

## 2024-06-17 LAB — CBC WITH DIFFERENTIAL/PLATELET
Abs Immature Granulocytes: 0.01 K/uL (ref 0.00–0.07)
Basophils Absolute: 0 K/uL (ref 0.0–0.1)
Basophils Relative: 1 %
Eosinophils Absolute: 0.1 K/uL (ref 0.0–0.5)
Eosinophils Relative: 2 %
HCT: 40.7 % (ref 36.0–46.0)
Hemoglobin: 13.1 g/dL (ref 12.0–15.0)
Immature Granulocytes: 0 %
Lymphocytes Relative: 14 %
Lymphs Abs: 0.6 K/uL — ABNORMAL LOW (ref 0.7–4.0)
MCH: 28 pg (ref 26.0–34.0)
MCHC: 32.2 g/dL (ref 30.0–36.0)
MCV: 87 fL (ref 80.0–100.0)
Monocytes Absolute: 0.2 K/uL (ref 0.1–1.0)
Monocytes Relative: 4 %
Neutro Abs: 3.4 K/uL (ref 1.7–7.7)
Neutrophils Relative %: 79 %
Platelets: 239 K/uL (ref 150–400)
RBC: 4.68 MIL/uL (ref 3.87–5.11)
RDW: 12.9 % (ref 11.5–15.5)
WBC: 4.2 K/uL (ref 4.0–10.5)
nRBC: 0 % (ref 0.0–0.2)

## 2024-06-17 LAB — COMPREHENSIVE METABOLIC PANEL WITH GFR
ALT: 58 U/L — ABNORMAL HIGH (ref 0–44)
AST: 97 U/L — ABNORMAL HIGH (ref 15–41)
Albumin: 3.5 g/dL (ref 3.5–5.0)
Alkaline Phosphatase: 48 U/L (ref 38–126)
Anion gap: 10 (ref 5–15)
BUN: 11 mg/dL (ref 6–20)
CO2: 22 mmol/L (ref 22–32)
Calcium: 8.9 mg/dL (ref 8.9–10.3)
Chloride: 99 mmol/L (ref 98–111)
Creatinine, Ser: 0.9 mg/dL (ref 0.44–1.00)
GFR, Estimated: >60 mL/min (ref >60)
Glucose, Bld: 97 mg/dL (ref 70–99)
Potassium: 3.8 mmol/L (ref 3.5–5.1)
Sodium: 131 mmol/L — ABNORMAL LOW (ref 135–145)
Total Bilirubin: 0.7 mg/dL (ref 0.0–1.2)
Total Protein: 8.8 g/dL — ABNORMAL HIGH (ref 6.5–8.1)

## 2024-06-17 MED ORDER — HYDROXYZINE HCL 25 MG PO TABS
25.0000 mg | ORAL_TABLET | Freq: Once | ORAL | Status: AC
  Administered 2024-06-17: 25 mg via ORAL
  Filled 2024-06-17: qty 1

## 2024-06-17 NOTE — Discharge Instructions (Signed)
 Follow up with your doctor at [facility] practice for a repeat and evaluation of your abnormal labs and your blood pressure medication. Take hydroxyzine  every 6 hours for itching. Increase fluids

## 2024-06-17 NOTE — ED Triage Notes (Signed)
 Pt to ED via POV from home. Pt ambulatory to triage. Pt reports rash she noticed a few days ago on arms, legs, back and stomach. Pt reports did start a new BP med 2 wks ago. No changes to detergent or new foods.

## 2024-06-17 NOTE — ED Notes (Signed)
 See triage note  Presents with itching to arms,legs and torso  States this started couple of days ago  Describes as burning No resp issues

## 2024-06-17 NOTE — ED Provider Notes (Signed)
 [facility] Provider Note    Event Date/Time   First MD Initiated Contact with Patient 06/17/24 [phone]     (approximate)   History   Rash   HPI  Desiree Young is a 33 y.o. female   presents to the ED with complaint of itching for the last few days.  Patient states there is no actual rash but that she has itching to her extremities and torso.  Patient denies any difficulty breathing or shortness of breath.  Patient reports that she started blood pressure medication 2 weeks ago but no other changes in food or detergents.  Patient has history of hypertension.      Physical Exam   Triage Vital Signs: ED Triage Vitals  Encounter Vitals Group     BP 06/17/24 0836 123/82     Girls Systolic BP Percentile --      Girls Diastolic BP Percentile --      Boys Systolic BP Percentile --      Boys Diastolic BP Percentile --      Pulse Rate 06/17/24 0836 90     Resp 06/17/24 0836 18     Temp 06/17/24 0833 98.1 F (36.7 C)     Temp Source 06/17/24 0833 Oral     SpO2 06/17/24 0836 100 %     Weight 06/17/24 0833 159 lb (72.1 kg)     Height 06/17/24 0833 5' 5 (1.651 m)     Head Circumference --      Peak Flow --      Pain Score 06/17/24 0833 9     Pain Loc --      Pain Education --      Exclude from Growth Chart --     Most recent vital signs: Vitals:   06/17/24 0833 06/17/24 0836  BP:  123/82  Pulse:  90  Resp:  18  Temp: 98.1 F (36.7 C)   SpO2:  100%     General: Awake, no distress.  Alert, talkative, cooperative. CV:  Good peripheral perfusion.  Heart regular rate and rhythm. Resp:  Normal effort.  Lungs are clear bilaterally. Abd:  No distention.  Other:  No rash present.   ED Results / Procedures / Treatments   Labs (all labs ordered are listed, but only abnormal results are displayed) Labs Reviewed  CBC WITH DIFFERENTIAL/PLATELET - Abnormal; Notable for the following components:      Result Value   Lymphs Abs 0.6 (*)    All other  components within normal limits  COMPREHENSIVE METABOLIC PANEL WITH GFR - Abnormal; Notable for the following components:   Sodium 131 (*)    Total Protein 8.8 (*)    AST 97 (*)    ALT 58 (*)    All other components within normal limits       PROCEDURES:  Critical Care performed:   Procedures   MEDICATIONS ORDERED IN ED: Medications  hydrOXYzine  (ATARAX ) tablet 25 mg (25 mg Oral Given 06/17/24 0920)     IMPRESSION / MDM / ASSESSMENT AND PLAN / ED COURSE  I reviewed the triage vital signs and the nursing notes.   Differential diagnosis includes, but is not limited to, pruritus, rash, itching etiology uncertain.  33 year old female presents to the ED with complaint of generalized itching without rash.  Patient reports that she started blood pressure medication 2 weeks ago.  Initially she was prescribed this medication back in June but did not get it filled.  It  was when she went back to her PCP the beginning of September and was told that she still needed to take the blood pressure medication.  She has been taking it for approximately 2 weeks.  Patient was given hydralazine 25 mg while in the ED.  Lab work showed an elevation of her AST and ALT at 97 and 58 respectively.  In looking back through her records she had elevation of her LFTs 8 years ago.  At that time her AST was 233 and ALT 195.  Not told at that time she had any problems and repeat lab work was decreasing.  Patient reports that she does drink a couple shots occasionally but not on a regular basis.  She denies any abdominal pain or GI complaints.  She is strongly encouraged to follow-up with her PCP for repeat of her blood work and reevaluation.  She is to call tomorrow to talk to her PCP about her blood pressure medication and also make an appointment for repeat blood work.  She was discharged with a prescription for hydroxyzine  to take as needed.      Patient's presentation is most consistent with acute illness /  injury with system symptoms.  FINAL CLINICAL IMPRESSION(S) / ED DIAGNOSES   Final diagnoses:  Pruritus  Abnormal laboratory test     Rx / DC Orders   ED Discharge Orders     None        Note:  This document was prepared using Dragon voice recognition software and may include unintentional dictation errors.   Saunders Shona CROME, PA-C 06/17/24 1213    Willo Dunnings, MD 06/17/24 [phone]

## 2024-07-01 DIAGNOSIS — R519 Headache, unspecified: Secondary | ICD-10-CM | POA: Diagnosis not present

## 2024-07-01 DIAGNOSIS — R000 Tachycardia, unspecified: Secondary | ICD-10-CM | POA: Diagnosis not present

## 2024-07-01 DIAGNOSIS — I10 Essential (primary) hypertension: Secondary | ICD-10-CM | POA: Diagnosis not present

## 2024-07-01 DIAGNOSIS — Z79899 Other long term (current) drug therapy: Secondary | ICD-10-CM | POA: Diagnosis not present

## 2024-07-01 DIAGNOSIS — H538 Other visual disturbances: Secondary | ICD-10-CM | POA: Diagnosis not present

## 2024-07-01 DIAGNOSIS — Z880 Allergy status to penicillin: Secondary | ICD-10-CM | POA: Diagnosis not present

## 2024-07-01 DIAGNOSIS — Z1152 Encounter for screening for COVID-19: Secondary | ICD-10-CM | POA: Diagnosis not present

## 2024-07-04 ENCOUNTER — Encounter: Payer: Self-pay | Admitting: Physician Assistant

## 2024-07-04 ENCOUNTER — Ambulatory Visit (INDEPENDENT_AMBULATORY_CARE_PROVIDER_SITE_OTHER): Admitting: Physician Assistant

## 2024-07-04 VITALS — BP 138/102 | HR 110 | Resp 14 | Ht 65.0 in | Wt 146.8 lb

## 2024-07-04 DIAGNOSIS — E049 Nontoxic goiter, unspecified: Secondary | ICD-10-CM

## 2024-07-04 DIAGNOSIS — M791 Myalgia, unspecified site: Secondary | ICD-10-CM | POA: Insufficient documentation

## 2024-07-04 DIAGNOSIS — M25561 Pain in right knee: Secondary | ICD-10-CM

## 2024-07-04 DIAGNOSIS — Z720 Tobacco use: Secondary | ICD-10-CM | POA: Diagnosis not present

## 2024-07-04 DIAGNOSIS — M25562 Pain in left knee: Secondary | ICD-10-CM

## 2024-07-04 DIAGNOSIS — G8929 Other chronic pain: Secondary | ICD-10-CM

## 2024-07-04 DIAGNOSIS — I10 Essential (primary) hypertension: Secondary | ICD-10-CM | POA: Diagnosis not present

## 2024-07-04 DIAGNOSIS — F419 Anxiety disorder, unspecified: Secondary | ICD-10-CM

## 2024-07-04 DIAGNOSIS — F32A Depression, unspecified: Secondary | ICD-10-CM | POA: Insufficient documentation

## 2024-07-04 DIAGNOSIS — M2550 Pain in unspecified joint: Secondary | ICD-10-CM | POA: Insufficient documentation

## 2024-07-04 MED ORDER — CELECOXIB 100 MG PO CAPS: 100.0000 mg | ORAL_CAPSULE | Freq: Two times a day (BID) | ORAL | 0 refills | Status: AC

## 2024-07-04 MED ORDER — CITALOPRAM HYDROBROMIDE 10 MG PO TABS: 10.0000 mg | ORAL_TABLET | Freq: Every day | ORAL | 1 refills | Status: AC

## 2024-07-04 MED ORDER — AMLODIPINE BESYLATE 5 MG PO TABS: 5.0000 mg | ORAL_TABLET | Freq: Every day | ORAL | 0 refills | Status: DC

## 2024-07-04 NOTE — Progress Notes (Signed)
 Established patient visit  Patient: Desiree Young   DOB: 1991/07/12   33 y.o. Female  MRN: [id number] Visit Date: 07/04/2024  Today's healthcare provider: Rithik Odea, PA-C   Chief Complaint  Patient presents with   Medical Management of Chronic Issues    Hydrochlorothiazide - had a reaction to medication Valsartan - itching all over needs to get a different one 1 week ago stopped medication 06/29/24. BP reading at home have been high since stopping medicine   Subjective     HPI     Medical Management of Chronic Issues    Additional comments: Hydrochlorothiazide - had a reaction to medication Valsartan - itching all over needs to get a different one 1 week ago stopped medication 06/29/24. BP reading at home have been high since stopping medicine      Last edited by Wilfred Hargis RAMAN, CMA on 07/04/2024  2:47 PM.       Discussed the use of AI scribe software for clinical note transcription with the patient, who gave verbal consent to proceed.  History of Present Illness Desiree Young is a 33 year old female who presents for chronic pain and hypertension management. She is accompanied by her partner's father who is waiting in the waiting room.  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 Despite a normal evaluation by a knee specialist, the pain persists and affects her daily activities. She often feels too tired to do anything but rest when not working.  She has hypertension and has not previously tried amlodipine. She experiences anxiety, particularly in crowded settings, and has a history of depression and anxiety for approximately 14 years. She has noticed an increase in anxiety symptoms recently.  She experiences occasional difficulty breathing, especially when wearing a mask at work, but denies chest pain or rapid heartbeat. She experiences wrist pain, which she attributes to overuse at work.  She has a  history of smoking and has made dietary changes, including eating more baked chicken and vegetables, but does not engage in regular exercise outside of mandatory stretching at work.        05/31/2024    1:47 PM 04/20/2024    9:25 AM  Depression screen PHQ 2/9  Decreased Interest 2 2  Down, Depressed, Hopeless 1 2  PHQ - 2 Score 3 4  Altered sleeping 2 2  Tired, decreased energy 1 2  Change in appetite 0 0  Feeling bad or failure about yourself  0 2  Trouble concentrating 0 1  Moving slowly or fidgety/restless 0 1  Suicidal thoughts 0 0  PHQ-9 Score 6 12  Difficult doing work/chores Not difficult at all Not difficult at all      05/31/2024    1:47 PM 04/20/2024    9:25 AM  GAD 7 : Generalized Anxiety Score  Nervous, Anxious, on Edge 1 2  Control/stop worrying 1 2  Worry too much - different things 2 2  Trouble relaxing 1 1  Restless 0 1  Easily annoyed or irritable 1 1  Afraid - awful might happen 2 2  Total GAD 7 Score 8 11  Anxiety Difficulty Not difficult at all Not difficult at all    Medications: Outpatient Medications Prior to Visit  Medication Sig   [DISCONTINUED] hydrochlorothiazide  (MICROZIDE ) 12.5 MG capsule Take 1 capsule (12.5 mg total) by mouth daily. (Patient not taking: Reported on 05/31/2024)   [DISCONTINUED] meloxicam (MOBIC) 7.5 MG tablet Take 7.5 mg  by mouth 2 (two) times daily.   [DISCONTINUED] valsartan  (DIOVAN ) 40 MG tablet Take 1 tablet (40 mg total) by mouth daily. (Patient not taking: Reported on 05/31/2024)   No facility-administered medications prior to visit.    Review of Systems All negative Except see HPI       Objective    BP (!) 138/102   Pulse (!) 110   Resp 14   Ht 5' 5 (1.651 m)   Wt 146 lb 12.8 oz (66.6 kg)   LMP 06/23/2024 (Exact Date)   SpO2 100%   BMI 24.43 kg/m     Physical Exam Vitals reviewed.  Constitutional:      General: She is not in acute distress.    Appearance: Normal appearance. She is well-developed. She  is not diaphoretic.  HENT:     Head: Normocephalic and atraumatic.  Eyes:     General: No scleral icterus.    Conjunctiva/sclera: Conjunctivae normal.  Neck:     Thyroid: No thyromegaly.  Cardiovascular:     Rate and Rhythm: Normal rate and regular rhythm.     Pulses: Normal pulses.     Heart sounds: Normal heart sounds. No murmur heard. Pulmonary:     Effort: Pulmonary effort is normal. No respiratory distress.     Breath sounds: Normal breath sounds. No wheezing, rhonchi or rales.  Musculoskeletal:     Cervical back: Neck supple.     Right lower leg: No edema.     Left lower leg: No edema.  Lymphadenopathy:     Cervical: No cervical adenopathy.  Skin:    General: Skin is warm and dry.     Findings: No rash.  Neurological:     Mental Status: She is alert and oriented to person, place, and time. Mental status is at baseline.  Psychiatric:        Mood and Affect: Mood normal.        Behavior: Behavior normal.      No results found for any visits on 07/04/24.      Assessment & Plan Hypertension Chronic and unstable  elevated diastolic blood pressure, likely influenced by stress and pain.  Patient cannot tolerate hydrochlorothiazide  and losartan - Start amlodipine 5 mg daily. - Monitor blood pressure at home.  Bring records and blood pressure device to the next appointment. - Follow up in two weeks.  Chronic knee pain, shoulder pain Arthralgias/myalgias  musculoskeletal pain related to occupational overuse Pain in arms and knees due to occupational overuse, affecting daily life. - Use Tylenol  as needed. - Prescribe Celebrex if necessary. - Apply Voltaren gel. - Encourage self-massage and stretching. - Advise contrast baths for wrist pain. - Recommend wrist brace or sleeve. - Encourage regular exercise, including yoga or Pilates.  Depression and anxiety Chronic and unstable  Moderate depression and anxiety exacerbated by chronic pain and stress. Previous  medication discontinued. - Start citalopram 10 mg, titrate as needed. - Refer to counseling services. - Complete depression and anxiety questionnaire at next visit. Collaboration of Care: Medication Management AEB  , Primary Care Provider AEB  , Psychiatrist AEB  , and Referral or follow-up with counselor/therapist AEB    Patient/Guardian was advised Release of Information must be obtained prior to any record release in order to collaborate their care with an outside provider. Patient/Guardian was advised if they have not already done so to contact the registration department to sign all necessary forms in order for us  to release information regarding their care.  Consent: Patient/Guardian gives verbal consent for treatment and assignment of benefits for services provided during this visit. Patient/Guardian expressed understanding and agreed to proceed.   Suspected thyroid disorder Occasional sensation of a lump in the throat, previous normal TSH. - Order thyroid ultrasound. Most recent TSH from 07/01/24 was within the normal limit  Tobacco use Chronic  Counseling was given:  continued tobacco use impacting health. Cessation advised   Primary hypertension  - amLODipine (NORVASC) 5 MG tablet; Take 1 tablet (5 mg total) by mouth daily.  Dispense: 90 tablet; Refill: 0  Goiter - US  THYROID; Future  No orders of the defined types were placed in this encounter.   No follow-ups on file.   The patient was advised to call back or seek an in-person evaluation if the symptoms worsen or if the condition fails to improve as anticipated.  I discussed the assessment and treatment plan with the patient. The patient was provided an opportunity to ask questions and all were answered. The patient agreed with the plan and demonstrated an understanding of the instructions.  I, Cobin Cadavid, PA-C have reviewed all documentation for this visit. The documentation on 07/04/2024  for the exam, diagnosis,  procedures, and orders are all accurate and complete.  Jolynn Spencer, [facility], MMS [facility] [phone] (phone) [phone] (fax)  [facility]

## 2024-07-19 ENCOUNTER — Ambulatory Visit: Admitting: Physician Assistant

## 2024-08-01 ENCOUNTER — Ambulatory Visit (INDEPENDENT_AMBULATORY_CARE_PROVIDER_SITE_OTHER): Admitting: Physician Assistant

## 2024-08-01 ENCOUNTER — Encounter: Payer: Self-pay | Admitting: Physician Assistant

## 2024-08-01 VITALS — BP 129/89 | HR 95 | Ht 65.0 in | Wt 141.0 lb

## 2024-08-01 DIAGNOSIS — Z720 Tobacco use: Secondary | ICD-10-CM | POA: Diagnosis not present

## 2024-08-01 DIAGNOSIS — I10 Essential (primary) hypertension: Secondary | ICD-10-CM

## 2024-08-01 DIAGNOSIS — F32A Depression, unspecified: Secondary | ICD-10-CM | POA: Diagnosis not present

## 2024-08-01 DIAGNOSIS — F419 Anxiety disorder, unspecified: Secondary | ICD-10-CM

## 2024-08-01 NOTE — Progress Notes (Signed)
 Established patient visit  Patient: Desiree Young   DOB: 1990-10-10   33 y.o. Female  MRN: [id number] Visit Date: 08/01/2024  Today's healthcare provider: Jolynn Spencer, PA-C   Chief Complaint  Patient presents with   Medical Management of Chronic Issues    2 weeks follow-up HTN   Subjective     HPI     Medical Management of Chronic Issues    Additional comments: 2 weeks follow-up HTN      Last edited by Rosas, Joseline E, CMA on 08/01/2024  1:20 PM.       Discussed the use of AI scribe software for clinical note transcription with the patient, who gave verbal consent to proceed.  History of Present Illness Desiree Young is a 33 year old female with hypertension who presents for blood pressure management.  She is taking amlodipine 5 mg for hypertension. Blood pressure readings have been 137/94 and 139/97, with a home reading of 129/97. She has adjusted her medication timing to the evening due to drowsiness. Dietary changes include reduced salt intake and increased fruits and vegetables. She is drinking up to twelve glasses of water daily.  She experiences phases of feeling down and has anxiety in crowded places, avoiding eye contact. She has not started her prescribed depression medication but has a counseling appointment scheduled for August 13, 2024.  She is not engaging in regular exercise but walks frequently at work.       07/04/2024    3:17 PM 05/31/2024    1:47 PM 04/20/2024    9:25 AM  Depression screen PHQ 2/9  Decreased Interest 3 2 2   Down, Depressed, Hopeless 3 1 2   PHQ - 2 Score 6 3 4   Altered sleeping 2 2 2   Tired, decreased energy 2 1 2   Change in appetite 2 0 0  Feeling bad or failure about yourself  2 0 2  Trouble concentrating 2 0 1  Moving slowly or fidgety/restless 2 0 1  Suicidal thoughts 2 0 0  PHQ-9 Score 20 6 12   Difficult doing work/chores Extremely dIfficult Not difficult at all Not difficult at all      07/04/2024    3:17 PM  05/31/2024    1:47 PM 04/20/2024    9:25 AM  GAD 7 : Generalized Anxiety Score  Nervous, Anxious, on Edge 3 1 2   Control/stop worrying 3 1 2   Worry too much - different things 3 2 2   Trouble relaxing 2 1 1   Restless 2 0 1  Easily annoyed or irritable 2 1 1   Afraid - awful might happen 3 2 2   Total GAD 7 Score 18 8 11   Anxiety Difficulty Extremely difficult Not difficult at all Not difficult at all    Medications: Outpatient Medications Prior to Visit  Medication Sig   amLODipine (NORVASC) 5 MG tablet Take 1 tablet (5 mg total) by mouth daily.   celecoxib (CELEBREX) 100 MG capsule Take 1 capsule (100 mg total) by mouth 2 (two) times daily.   citalopram (CELEXA) 10 MG tablet Take 1 tablet (10 mg total) by mouth daily.   No facility-administered medications prior to visit.    Review of Systems All negative Except see HPI       Objective    BP 129/89 (BP Location: Left Arm, Patient Position: Sitting, Cuff Size: Normal) Comment: Patient's BP cuff  Pulse 95   Ht 5' 5 (1.651 m)   Wt 141 lb (64 kg)   LMP  06/23/2024 (Exact Date)   BMI 23.46 kg/m     Physical Exam Vitals reviewed.  Constitutional:      General: She is not in acute distress.    Appearance: Normal appearance. She is well-developed. She is not diaphoretic.  HENT:     Head: Normocephalic and atraumatic.  Eyes:     General: No scleral icterus.    Conjunctiva/sclera: Conjunctivae normal.  Neck:     Thyroid: No thyromegaly.  Cardiovascular:     Rate and Rhythm: Normal rate and regular rhythm.     Pulses: Normal pulses.     Heart sounds: Normal heart sounds. No murmur heard. Pulmonary:     Effort: Pulmonary effort is normal. No respiratory distress.     Breath sounds: Normal breath sounds. No wheezing, rhonchi or rales.  Musculoskeletal:     Cervical back: Neck supple.     Right lower leg: No edema.     Left lower leg: No edema.  Lymphadenopathy:     Cervical: No cervical adenopathy.  Skin:     General: Skin is warm and dry.     Findings: No rash.  Neurological:     Mental Status: She is alert and oriented to person, place, and time. Mental status is at baseline.  Psychiatric:        Mood and Affect: Mood normal.        Behavior: Behavior normal.      No results found for any visits on 08/01/24.      Assessment & Plan Essential hypertension Recently diagnosed Blood pressure close to target. Amlodipine 5 mg partially effective. Dietary changes implemented. Exercise limited. - Increased amlodipine to 7.5 mg daily. - Instructed to monitor for leg swelling. - Recommended compression stockings if swelling occurs. - Encouraged structured exercise, including walking and breathing exercises. - Scheduled follow-up in 10 weeks.  Anxiety disorder and depressive disorder Chronic and unstable Anxiety in crowded places, avoids eye contact. Depression present but not severe. Counseling scheduled. - Prescribed depression medication; not yet started. - Attend counseling session on November 17th. - Engage in regular exercise and meditation. - Consider Tai Chi or other slow movement exercises. Will follow-up  Tobacco abuse Chronic Cessation advised, could be attributed to unstable BP Will follow-up   No orders of the defined types were placed in this encounter.   No follow-ups on file.   The patient was advised to call back or seek an in-person evaluation if the symptoms worsen or if the condition fails to improve as anticipated.  I discussed the assessment and treatment plan with the patient. The patient was provided an opportunity to ask questions and all were answered. The patient agreed with the plan and demonstrated an understanding of the instructions.  I, Albeiro Trompeter, PA-C have reviewed all documentation for this visit. The documentation on 08/01/2024  for the exam, diagnosis, procedures, and orders are all accurate and complete.  Jolynn Spencer, [facility], MMS [facility] [phone] (phone) [phone] (fax)  [facility]

## 2024-08-13 ENCOUNTER — Institutional Professional Consult (permissible substitution): Admitting: Licensed Clinical Social Worker

## 2024-09-22 ENCOUNTER — Other Ambulatory Visit: Payer: Self-pay | Admitting: Physician Assistant

## 2024-09-22 DIAGNOSIS — I10 Essential (primary) hypertension: Secondary | ICD-10-CM

## 2024-10-10 ENCOUNTER — Ambulatory Visit: Admitting: Physician Assistant

## 2024-10-10 VITALS — BP 122/81 | HR 72 | Ht 65.0 in | Wt 140.0 lb

## 2024-10-10 DIAGNOSIS — F32A Depression, unspecified: Secondary | ICD-10-CM | POA: Diagnosis not present

## 2024-10-10 DIAGNOSIS — F419 Anxiety disorder, unspecified: Secondary | ICD-10-CM | POA: Diagnosis not present

## 2024-10-10 DIAGNOSIS — I10 Essential (primary) hypertension: Secondary | ICD-10-CM

## 2024-10-10 DIAGNOSIS — Z01419 Encounter for gynecological examination (general) (routine) without abnormal findings: Secondary | ICD-10-CM

## 2024-10-10 DIAGNOSIS — Z1159 Encounter for screening for other viral diseases: Secondary | ICD-10-CM

## 2024-10-10 DIAGNOSIS — Z23 Encounter for immunization: Secondary | ICD-10-CM

## 2024-10-10 DIAGNOSIS — Z124 Encounter for screening for malignant neoplasm of cervix: Secondary | ICD-10-CM

## 2024-10-10 DIAGNOSIS — E049 Nontoxic goiter, unspecified: Secondary | ICD-10-CM

## 2024-10-10 NOTE — Progress Notes (Signed)
 " Established patient visit  Patient: Desiree Young   DOB: [birth year]   33 y.o. Female  MRN: [id number] Visit Date: 10/10/2024  Today's healthcare provider: Jolynn Spencer, PA-C   Chief Complaint  Patient presents with   Hypertension    Patient presents for follow up of her blood pressure   Subjective     HPI     Hypertension    Additional comments: Patient presents for follow up of her blood pressure      Last edited by Kathi Buel BIRCH, CMA on 10/10/2024  1:35 PM.       Hypertension, follow-up  BP Readings from Last 3 Encounters:  10/10/24 122/81  08/01/24 129/89  07/04/24 (!) 138/102   Wt Readings from Last 3 Encounters:  10/10/24 140 lb (63.5 kg)  08/01/24 141 lb (64 kg)  07/04/24 146 lb 12.8 oz (66.6 kg)     She was last seen for hypertension 2 months ago.  BP at that visit was see table. Management since that visit includes amlodipine  5.  She reports good compliance with treatment. She is not having side effects.    Symptoms: No chest pain No chest pressure  No palpitations No syncope  No dyspnea No orthopnea  No paroxysmal nocturnal dyspnea No lower extremity edema   Pertinent labs Lab Results  Component Value Date   CHOL 144 05/31/2024   HDL 31 (L) 05/31/2024   LDLCALC 86 05/31/2024   TRIG 155 (H) 05/31/2024   CHOLHDL 4.6 (H) 05/31/2024   Lab Results  Component Value Date   NA 131 (L) 06/17/2024   K 3.8 06/17/2024   CREATININE 0.90 06/17/2024   GFRNONAA >60 06/17/2024   GLUCOSE 97 06/17/2024   TSH 1.210 05/31/2024     The ASCVD Risk score (Arnett DK, et al., 2019) failed to calculate for the following reasons:   The 2019 ASCVD risk score is only valid for ages 43 to 25  ---------------------------------------------------------------------------------------------------      07/04/2024    3:17 PM 05/31/2024    1:47 PM 04/20/2024    9:25 AM  Depression screen PHQ 2/9  Decreased Interest 3 2 2   Down, Depressed, Hopeless 3 1 2   PHQ - 2 Score  6 3 4   Altered sleeping 2 2 2   Tired, decreased energy 2 1 2   Change in appetite 2 0 0  Feeling bad or failure about yourself  2 0 2  Trouble concentrating 2 0 1  Moving slowly or fidgety/restless 2 0 1  Suicidal thoughts 2 0 0  PHQ-9 Score 20  6  12    Difficult doing work/chores Extremely dIfficult Not difficult at all Not difficult at all     Data saved with a previous flowsheet row definition      07/04/2024    3:17 PM 05/31/2024    1:47 PM 04/20/2024    9:25 AM  GAD 7 : Generalized Anxiety Score  Nervous, Anxious, on Edge 3 1 2   Control/stop worrying 3 1 2   Worry too much - different things 3 2 2   Trouble relaxing 2 1 1   Restless 2 0 1  Easily annoyed or irritable 2 1 1   Afraid - awful might happen 3 2 2   Total GAD 7 Score 18 8 11   Anxiety Difficulty Extremely difficult Not difficult at all Not difficult at all    Medications: Show/hide medication list[1]  Review of Systems All negative Except see HPI       Objective  BP 122/81 (BP Location: Right Arm, Patient Position: Sitting)   Pulse 72   Ht 5' 5 (1.651 m)   Wt 140 lb (63.5 kg)   SpO2 100%   BMI 23.30 kg/m     Physical Exam Vitals reviewed.  Constitutional:      General: She is not in acute distress.    Appearance: Normal appearance. She is well-developed. She is not diaphoretic.  HENT:     Head: Normocephalic and atraumatic.  Eyes:     General: No scleral icterus.    Conjunctiva/sclera: Conjunctivae normal.  Neck:     Thyroid : No thyromegaly.  Cardiovascular:     Rate and Rhythm: Normal rate and regular rhythm.     Pulses: Normal pulses.     Heart sounds: Normal heart sounds. No murmur heard. Pulmonary:     Effort: Pulmonary effort is normal. No respiratory distress.     Breath sounds: Normal breath sounds. No wheezing, rhonchi or rales.  Musculoskeletal:     Cervical back: Neck supple.     Right lower leg: No edema.     Left lower leg: No edema.  Lymphadenopathy:     Cervical: No  cervical adenopathy.  Skin:    General: Skin is warm and dry.     Findings: No rash.  Neurological:     Mental Status: She is alert and oriented to person, place, and time. Mental status is at baseline.  Psychiatric:        Mood and Affect: Mood normal.        Behavior: Behavior normal.      No results found for any visits on 10/10/24.      Assessment & Plan  Primary hypertension (Primary) Chronic and stable Continue amlodipine  5mg  daily Advised to measure BP at home daily and to bring BP devise to the follow-up Continue lifestyle modifications Will follow-up  Anxiety and depression Chronic and unstable Advised to proceed with our clinic counselor when pt is ready Continue citalopram  10mg  Will follow-up  Cervical cancer screening Waiting for pending records from previous provider.  Need for pneumococcal 20-valent conjugate vaccination Declined for now Advised to consider.  Need for HPV vaccination Advised to consider.   Encounter for gynecological examination with Papanicolaou smear of cervix  - Ambulatory referral to Obstetrics / Gynecology  Advised to schedule CPE in may of 2026 No orders of the defined types were placed in this encounter.   No follow-ups on file.   The patient was advised to call back or seek an in-person evaluation if the symptoms worsen or if the condition fails to improve as anticipated.  I discussed the assessment and treatment plan with the patient. The patient was provided an opportunity to ask questions and all were answered. The patient agreed with the plan and demonstrated an understanding of the instructions.  I, Krish Bailly, PA-C have reviewed all documentation for this visit. The documentation on 10/10/2024  for the exam, diagnosis, procedures, and orders are all accurate and complete.  Jolynn Spencer, [facility], MMS [facility] [phone] (phone) [phone] (fax)  [facility]     [1]   Outpatient Medications Prior to Visit  Medication Sig   amLODipine  (NORVASC ) 5 MG tablet Take 1 tablet by mouth once daily   celecoxib  (CELEBREX ) 100 MG capsule Take 1 capsule (100 mg total) by mouth 2 (two) times daily.   citalopram  (CELEXA ) 10 MG tablet Take 1 tablet (10 mg total) by mouth daily.   No facility-administered  medications prior to visit.   "

## 2024-10-11 ENCOUNTER — Encounter: Payer: Self-pay | Admitting: Physician Assistant

## 2025-02-07 ENCOUNTER — Encounter: Admitting: Physician Assistant
